# Patient Record
Sex: Male | Born: 1935 | Race: White | Hispanic: No | Marital: Married | Smoking: Never smoker
Health system: Southern US, Community
[De-identification: ages and names within clinical notes are randomized; demographics above are authoritative.]

## PROBLEM LIST (undated history)

## (undated) DIAGNOSIS — R911 Solitary pulmonary nodule: Secondary | ICD-10-CM

## (undated) DIAGNOSIS — R030 Elevated blood-pressure reading, without diagnosis of hypertension: Secondary | ICD-10-CM

## (undated) DIAGNOSIS — C61 Malignant neoplasm of prostate: Secondary | ICD-10-CM

## (undated) DIAGNOSIS — I77810 Thoracic aortic ectasia: Secondary | ICD-10-CM

## (undated) DIAGNOSIS — D649 Anemia, unspecified: Secondary | ICD-10-CM

## (undated) DIAGNOSIS — Z85828 Personal history of other malignant neoplasm of skin: Secondary | ICD-10-CM

## (undated) DIAGNOSIS — J329 Chronic sinusitis, unspecified: Principal | ICD-10-CM

## (undated) DIAGNOSIS — Z86010 Personal history of colonic polyps: Secondary | ICD-10-CM

## (undated) DIAGNOSIS — R351 Nocturia: Secondary | ICD-10-CM

## (undated) DIAGNOSIS — R5383 Other fatigue: Secondary | ICD-10-CM

## (undated) DIAGNOSIS — N410 Acute prostatitis: Secondary | ICD-10-CM

## (undated) DIAGNOSIS — E785 Hyperlipidemia, unspecified: Secondary | ICD-10-CM

## (undated) DIAGNOSIS — R0602 Shortness of breath: Secondary | ICD-10-CM

## (undated) DIAGNOSIS — Z9849 Cataract extraction status, unspecified eye: Secondary | ICD-10-CM

## (undated) DIAGNOSIS — R251 Tremor, unspecified: Secondary | ICD-10-CM

## (undated) DIAGNOSIS — IMO0002 Reserved for concepts with insufficient information to code with codable children: Secondary | ICD-10-CM

## (undated) DIAGNOSIS — Z8711 Personal history of peptic ulcer disease: Secondary | ICD-10-CM

## (undated) DIAGNOSIS — T148XXA Other injury of unspecified body region, initial encounter: Principal | ICD-10-CM

## (undated) DIAGNOSIS — L309 Dermatitis, unspecified: Secondary | ICD-10-CM

## (undated) DIAGNOSIS — I10 Essential (primary) hypertension: Secondary | ICD-10-CM

## (undated) HISTORY — DX: Malignant neoplasm of prostate: C61

## (undated) HISTORY — DX: Elevated blood-pressure reading, without diagnosis of hypertension: R03.0

## (undated) HISTORY — DX: Tremor, unspecified: R25.1

## (undated) HISTORY — DX: Essential (primary) hypertension: I10

## (undated) HISTORY — DX: Other fatigue: R53.83

## (undated) HISTORY — DX: Personal history of peptic ulcer disease: Z87.11

## (undated) HISTORY — DX: Shortness of breath: R06.02

## (undated) HISTORY — DX: Personal history of colonic polyps: Z86.010

## (undated) HISTORY — DX: Reserved for concepts with insufficient information to code with codable children: IMO0002

## (undated) HISTORY — DX: Other injury of unspecified body region, initial encounter: T14.8XXA

## (undated) HISTORY — DX: Personal history of other malignant neoplasm of skin: Z85.828

## (undated) HISTORY — DX: Dermatitis, unspecified: L30.9

## (undated) HISTORY — DX: Hyperlipidemia, unspecified: E78.5

## (undated) HISTORY — DX: Cataract extraction status, unspecified eye: Z98.49

## (undated) HISTORY — DX: Anemia, unspecified: D64.9

## (undated) HISTORY — DX: Acute prostatitis: N41.0

## (undated) HISTORY — DX: Solitary pulmonary nodule: R91.1

## (undated) HISTORY — DX: Nocturia: R35.1

## (undated) HISTORY — PX: TONSILLECTOMY AND ADENOIDECTOMY: SHX28

## (undated) HISTORY — DX: Thoracic aortic ectasia: I77.810

## (undated) HISTORY — DX: Chronic sinusitis, unspecified: J32.9

## (undated) HISTORY — PX: OTHER SURGICAL HISTORY: SHX169

---

## 1958-01-24 HISTORY — PX: ABDOMINAL SURGERY: SHX537

## 1978-09-25 HISTORY — PX: COLONOSCOPY W/ BIOPSIES AND POLYPECTOMY: SHX1376

## 1999-09-24 ENCOUNTER — Encounter: Payer: Self-pay | Admitting: Family Medicine

## 1999-09-24 ENCOUNTER — Encounter: Admission: RE | Admit: 1999-09-24 | Discharge: 1999-09-24 | Payer: Self-pay

## 2001-08-17 ENCOUNTER — Encounter: Payer: Self-pay | Admitting: Orthopedic Surgery

## 2001-08-24 ENCOUNTER — Ambulatory Visit (HOSPITAL_COMMUNITY): Admission: RE | Admit: 2001-08-24 | Discharge: 2001-08-24 | Payer: Self-pay | Admitting: Orthopedic Surgery

## 2008-10-14 ENCOUNTER — Ambulatory Visit (HOSPITAL_COMMUNITY): Admission: RE | Admit: 2008-10-14 | Discharge: 2008-10-14 | Payer: Self-pay | Admitting: Urology

## 2008-11-28 ENCOUNTER — Ambulatory Visit (HOSPITAL_BASED_OUTPATIENT_CLINIC_OR_DEPARTMENT_OTHER): Admission: RE | Admit: 2008-11-28 | Discharge: 2008-11-28 | Payer: Self-pay | Admitting: Urology

## 2010-03-01 ENCOUNTER — Encounter: Payer: Self-pay | Admitting: Family Medicine

## 2010-03-01 ENCOUNTER — Ambulatory Visit (INDEPENDENT_AMBULATORY_CARE_PROVIDER_SITE_OTHER): Payer: Medicare HMO | Admitting: Family Medicine

## 2010-03-01 ENCOUNTER — Ambulatory Visit (INDEPENDENT_AMBULATORY_CARE_PROVIDER_SITE_OTHER): Payer: Self-pay | Admitting: Family Medicine

## 2010-03-01 ENCOUNTER — Other Ambulatory Visit (HOSPITAL_COMMUNITY): Payer: Self-pay | Admitting: Urology

## 2010-03-01 DIAGNOSIS — E785 Hyperlipidemia, unspecified: Secondary | ICD-10-CM

## 2010-03-01 DIAGNOSIS — R079 Chest pain, unspecified: Secondary | ICD-10-CM | POA: Insufficient documentation

## 2010-03-01 DIAGNOSIS — R030 Elevated blood-pressure reading, without diagnosis of hypertension: Secondary | ICD-10-CM

## 2010-03-01 DIAGNOSIS — Z860100 Personal history of colon polyps, unspecified: Secondary | ICD-10-CM

## 2010-03-01 DIAGNOSIS — Z8711 Personal history of peptic ulcer disease: Secondary | ICD-10-CM

## 2010-03-01 DIAGNOSIS — Z85828 Personal history of other malignant neoplasm of skin: Secondary | ICD-10-CM

## 2010-03-01 DIAGNOSIS — C61 Malignant neoplasm of prostate: Secondary | ICD-10-CM

## 2010-03-01 DIAGNOSIS — R351 Nocturia: Secondary | ICD-10-CM

## 2010-03-01 DIAGNOSIS — R0789 Other chest pain: Secondary | ICD-10-CM

## 2010-03-01 DIAGNOSIS — Z9849 Cataract extraction status, unspecified eye: Secondary | ICD-10-CM | POA: Insufficient documentation

## 2010-03-01 HISTORY — DX: Nocturia: R35.1

## 2010-03-01 HISTORY — DX: Cataract extraction status, unspecified eye: Z98.49

## 2010-03-01 HISTORY — DX: Malignant neoplasm of prostate: C61

## 2010-03-01 HISTORY — DX: Personal history of colonic polyps: Z86.010

## 2010-03-01 HISTORY — DX: Personal history of colon polyps, unspecified: Z86.0100

## 2010-03-01 HISTORY — DX: Hyperlipidemia, unspecified: E78.5

## 2010-03-01 HISTORY — DX: Personal history of peptic ulcer disease: Z87.11

## 2010-03-01 HISTORY — DX: Personal history of other malignant neoplasm of skin: Z85.828

## 2010-03-01 HISTORY — DX: Elevated blood-pressure reading, without diagnosis of hypertension: R03.0

## 2010-03-04 ENCOUNTER — Encounter: Payer: Self-pay | Admitting: *Deleted

## 2010-03-11 ENCOUNTER — Ambulatory Visit (HOSPITAL_COMMUNITY)
Admission: RE | Admit: 2010-03-11 | Discharge: 2010-03-11 | Disposition: A | Discharge status: Home / Self Care | Payer: Medicare HMO | Source: Ambulatory Visit | Attending: Urology | Admitting: Urology

## 2010-03-11 ENCOUNTER — Encounter (HOSPITAL_COMMUNITY): Payer: Self-pay

## 2010-03-11 ENCOUNTER — Encounter (HOSPITAL_COMMUNITY)
Admission: RE | Admit: 2010-03-11 | Discharge: 2010-03-11 | Disposition: A | Discharge status: Home / Self Care | Payer: Medicare HMO | Source: Ambulatory Visit | Attending: Urology | Admitting: Urology

## 2010-03-11 DIAGNOSIS — M47812 Spondylosis without myelopathy or radiculopathy, cervical region: Secondary | ICD-10-CM | POA: Insufficient documentation

## 2010-03-11 DIAGNOSIS — C61 Malignant neoplasm of prostate: Secondary | ICD-10-CM

## 2010-03-11 DIAGNOSIS — M79609 Pain in unspecified limb: Secondary | ICD-10-CM | POA: Insufficient documentation

## 2010-03-11 DIAGNOSIS — R937 Abnormal findings on diagnostic imaging of other parts of musculoskeletal system: Secondary | ICD-10-CM | POA: Insufficient documentation

## 2010-03-11 MED ORDER — TECHNETIUM TC 99M MEDRONATE IV KIT: 25.0000 | PACK | Freq: Once | INTRAVENOUS | Status: AC | PRN

## 2010-03-11 NOTE — Assessment & Plan Note (Signed)
Summary: New est care/dt [employer]   Vital Signs:  Patient profile:   77 year old Anderson Height:      66 inches (167.Sergio cm) Weight:      142.50 pounds (Sergio.77 kg) BMI:     23.08 O2 Sat:      99 % on Room air Temp:     98.1 degrees F (36.72 degrees C) oral Pulse rate:   74 / minute BP sitting:   171 / 76  (right arm) Cuff size:   regular  Vitals Entered By: Josph Macho RMA (March 01, 2010 1:19 PM)  O2 Flow:  Room air  Serial Vital Signs/Assessments:  Time      Position  BP       Pulse  Resp  Temp     By                     132/80                         Danise Edge MD  CC: Establish new patient/ CF Is Patient Diabetic? No   History of Present Illness: Patient is a Sergio Anderson in today for new patient appt. He is generally geeling well although he does note he has recently had a spike in his PSA levels. He has a history of Prostate Cancer treated by Dr Vonita Moss of Urology. Patient notes last year his PSA was well below 1 and now his level has spike up to over 7. He denies any concerning symptoms such as back or abdominal pain. No changes in urinary stream/flow/hesitancy. He does note some nocturia getting up every 2 or so hours to urinate every night but this is stable over many years even predating his Prostate CA diagnosis.No recent illness/f/c/anorexia/Palp/SOB/HA/congestion/ fatigue/malaise. Patient does note a long history of chest pain, atypical. HE has had a work up in the past which includes CXR and CT chest and he reports they were unremarkable. The nature of the pain has evolved and at this time it largely substernal and comes on only with certain postion changes/ significant twisting and heavy lifting and resolves when her alters his position again. He had his last colonoscopy in 1990, he reports he had only benign polyps found and he declines a repeat. Moves his bowels comfortably and he denies any bloody or tarry stool  Preventive Screening-Counseling &  Management  Alcohol-Tobacco     Smoking Status: never  Caffeine-Diet-Exercise     Does Patient Exercise: yes  Problems Prior to Update: 1)  Cataract Extractions, Bilateral, Hx of  (ICD-V45.61) 2)  Skin Cancer, Hx of  (ICD-V10.83) 3)  Nocturia  (ICD-788.43) 4)  Colonic Polyps, Hx of  (ICD-V12.72) 5)  Pud, Hx of  (ICD-V12.71) 6)  Elevated Bp Reading Without Dx Hypertension  (ICD-796.2) 7)  Chest Pain, Atypical  (ICD-786.59) 8)  Prostate Cancer  (ICD-185) 9)  Hyperlipidemia  (ICD-272.4)  Current Problems (verified): 1)  Cataract Extractions, Bilateral, Hx of  (ICD-V45.61) 2)  Skin Cancer, Hx of  (ICD-V10.83) 3)  Nocturia  (ICD-788.43) 4)  Colonic Polyps, Hx of  (ICD-V12.72) 5)  Pud, Hx of  (ICD-V12.71) 6)  Elevated Bp Reading Without Dx Hypertension  (ICD-796.2) 7)  Chest Pain, Atypical  (ICD-786.59) 8)  Prostate Cancer  (ICD-185) 9)  Hyperlipidemia  (ICD-272.4)  Medications Prior to Update: 1)  None  Current Medications (verified): 1)  Tamsulosin Hcl 0.4 Mg Caps (Tamsulosin Hcl) .Marland KitchenMarland KitchenMarland Kitchen  Once Daily 2)  Niaspan 500 Mg Cr-Tabs (Niacin (Antihyperlipidemic)) .Marland Kitchen.. 1 Tab By Mouth Q At Bedtime. Take An Aspirin 1 Hour Prior 3)  Simvastatin 20 Mg Tabs (Simvastatin) .... Take 1 Tablet By Mouth At Bedtime 4)  Aspirin 200 Mg Supp (Aspirin) .... At Bedtime  Allergies (verified): 1)  ! Vioxx  Comments:  Nurse/Medical Assistant: The patient's medications and allergies were reviewed with the patient and were updated in the Medication and Allergy Lists. Josph Macho RMA 03-18-2010 1:24 PM)  Past History:  Family History: Last updated: 2010/03/18 Father: deceased@70 , smoker, lung disease Mother: deceased@96 , stroke, hip fracture Siblings:  Sister: deceased@70 , cancer, lung, smoker Brother: 15, Heart disease, s/p MI Brother: 45, A&W Sister: 62, A&W Brother: deceased @ 2months with pneumonia Brother: deceased@65 , alcoholism MGM: deceased@90 , old age MGF: deceased@60 ,  alcoholism PGM: deceased near 32, colon cancer PGF: deceased near 49, ald age Children: Son: 56, A&W Son: 58, A&W  Social History: Last updated: Mar 18, 2010 Retired, ran the [employer] Married Never Smoked Alcohol use-no, quit 12 years ago never had a problem Wears seat belt regularly Regular exercise-yes, walks regularly No dietary restrictions  Risk Factors: Exercise: yes (2010-03-18)  Risk Factors: Smoking Status: never (2010/03/18)  Past Surgical History: 1960, abdominal surgery correction of PUD and hiatal hernia Tonsillectomy & Adenoidectomy Colon polypectomy, several , benign in 1980s Mose surgery to scalp  Family History: Father: deceased@70 , smoker, lung disease Mother: deceased@96 , stroke, hip fracture Siblings:  Sister: deceased@70 , cancer, lung, smoker Brother: 68, Heart disease, s/p MI Brother: 31, A&W Sister: 100, A&W Brother: deceased @ 2months with pneumonia Brother: deceased@65 , alcoholism MGM: deceased@90 , old age MGF: deceased@60 , alcoholism PGM: deceased near 77, colon cancer PGF: deceased near 28, ald age Children: Son: 88, A&W Son: 35, A&W  Social History: Retired, ran the [employer] Married Never Smoked Alcohol use-no, quit 12 years ago never had a problem Wears seat belt regularly Regular exercise-yes, walks regularly No dietary restrictions Smoking Status:  never Does Patient Exercise:  yes  Review of Systems  The patient denies anorexia, fever, weight loss, weight gain, vision loss, decreased hearing, hoarseness, chest pain, syncope, dyspnea on exertion, peripheral edema, prolonged cough, headaches, hemoptysis, abdominal pain, melena, hematochezia, severe indigestion/heartburn, hematuria, incontinence, genital sores, muscle weakness, suspicious skin lesions, transient blindness, difficulty walking, depression, unusual weight change, abnormal bleeding, and enlarged lymph nodes.    Physical Exam  General:   Well-developed,well-nourished,in no acute distress; alert,appropriate and cooperative throughout examination Head:  Normocephalic and atraumatic without obvious abnormalities. No apparent alopecia or balding. Eyes:  No corneal or conjunctival inflammation noted. EOMI. Perrla Ears:  External ear exam shows no significant lesions or deformities.  Otoscopic examination reveals clear canals, tympanic membranes are intact bilaterally without bulging, retraction, inflammation or discharge. Hearing is grossly normal bilaterally. Nose:  External nasal examination shows no deformity or inflammation. Nasal mucosa are pink and moist without lesions or exudates. Mouth:  Oral mucosa and oropharynx without lesions or exudate Neck:  No deformities, masses, or tenderness noted. Lungs:  Normal respiratory effort, chest expands symmetrically. Lungs are clear to auscultation, no crackles or wheezes. Heart:  Normal rate and regular rhythm. S1 and S2 normal without gallop, click, rub or other extra sounds.grade 2/6 systolic murmur.   Abdomen:  Bowel sounds positive,abdomen soft and non-tender without masses, organomegaly or hernias noted. Msk:  No deformity or scoliosis noted of thoracic or lumbar spine.   Pulses:  R and L carotid,dorsalis pedis and posterior tibial pulses  are full and equal bilaterally Extremities:  No clubbing, cyanosis, edema, or deformity noted with normal full range of motion of all joints.   Neurologic:  No cranial nerve deficits noted. Station and gait are normal. Plantar reflexes are down-going bilaterally. DTRs are symmetrical throughout. Sensory, motor and coordinative functions appear intact. Skin:  Intact without suspicious lesions or rashes Cervical Nodes:  No lymphadenopathy noted Psych:  Cognition and judgment appear intact. Alert and cooperative with normal attention span and concentration. No apparent delusions, illusions, hallucinations   Impression & Recommendations:  Problem # 1:   ELEVATED BP READING WITHOUT DX HYPERTENSION (ICD-796.2) Improved after sitting and talking, patient notes he has been told it was mildly hi int he past but it always returned to normal after resting. He is encouraged to avoid sodium and we will reassess at next visit. Report any concerning symptoms  Problem # 2:  SKIN CANCER, HX OF (ICD-V10.83) Follows with dermatology, sees Lannie Fields at Dr Elease Etienne office, encouraged him to continue with this care. No new concerning lesions  Problem # 3:  CHEST PAIN, ATYPICAL (ICD-786.59) Patient reports this is a long standing problem, has had a work up including a CT scan of chest as well as an xray which he report were normal. His pain now is mostly related to position changes and heavy lifting. No associated symptoms. He will avoid heavy lifting and report worsening symptoms. We will request old records to further evaluate. May use Tylenol as needed if pain worsens.  Problem # 4:  HYPERLIPIDEMIA (ICD-272.4)  His updated medication list for this problem includes:    Niaspan 500 Mg Cr-tabs (Niacin (antihyperlipidemic)) .Marland Kitchen... 1 tab by mouth q at bedtime. take an aspirin 1 hour prior    Simvastatin 20 Mg Tabs (Simvastatin) .Marland Kitchen... Take 1 tablet by mouth at bedtime Would like to switch to Simcor when his current supply of meds is gone. Will check FLP and request old records prior to changing meds.  Problem # 5:  COLONIC POLYPS, HX OF (ICD-V12.72) Patient declines a repeat colonoscopy and has no concerning symptoms, will check a CBC and monitor with patient  Complete Medication List: 1)  Tamsulosin Hcl 0.4 Mg Caps (Tamsulosin hcl) .... Once daily 2)  Niaspan 500 Mg Cr-tabs (Niacin (antihyperlipidemic)) .Marland Kitchen.. 1 tab by mouth q at bedtime. take an aspirin 1 hour prior 3)  Simvastatin 20 Mg Tabs (Simvastatin) .... Take 1 tablet by mouth at bedtime 4)  Aspirin 200 Mg Supp (Aspirin) .... At bedtime  Patient Instructions: 1)  Please schedule a follow-up  appointment in 1 to 2  month.  2)  Release of Records Dr Toni Arthurs   Orders Added: 1)  New Patient Level IV [99204]

## 2010-04-01 ENCOUNTER — Ambulatory Visit (INDEPENDENT_AMBULATORY_CARE_PROVIDER_SITE_OTHER): Payer: Medicare HMO | Admitting: Family Medicine

## 2010-04-01 ENCOUNTER — Encounter: Payer: Self-pay | Admitting: Family Medicine

## 2010-04-01 DIAGNOSIS — N410 Acute prostatitis: Secondary | ICD-10-CM

## 2010-04-01 DIAGNOSIS — IMO0002 Reserved for concepts with insufficient information to code with codable children: Secondary | ICD-10-CM

## 2010-04-01 DIAGNOSIS — R030 Elevated blood-pressure reading, without diagnosis of hypertension: Secondary | ICD-10-CM

## 2010-04-01 DIAGNOSIS — E785 Hyperlipidemia, unspecified: Secondary | ICD-10-CM

## 2010-04-01 HISTORY — DX: Reserved for concepts with insufficient information to code with codable children: IMO0002

## 2010-04-01 HISTORY — DX: Acute prostatitis: N41.0

## 2010-04-04 ENCOUNTER — Encounter: Payer: Self-pay | Admitting: *Deleted

## 2010-04-13 NOTE — Assessment & Plan Note (Signed)
Summary: 1 month fu/dt [employer]   Vital Signs:  Patient profile:   77 year old male Height:      66 inches (167.64 cm) Weight:      147.25 pounds (66.93 kg) O2 Sat:      98 % on Room air Temp:     98.2 degrees F (36.78 degrees C) oral Pulse rate:   66 / minute BP sitting:   161 / 88  (right arm) Cuff size:   regular  Vitals Entered By: Josph Macho RMA (April 01, 2010 10:21 AM)  O2 Flow:  Room air CC: follow-up visit/ CF Is Patient Diabetic? No   History of Present Illness: Patient is a 77 yo male in today for follow up on his new patient appt. He is accompanied by his wife who has been drinking more again and he is under a great deal of stress. he feels his blood pressure is up but this reason. He was also recently seen his urologist and was told he had acuteprostatitis but has not taken any antibiotics. He does note urinary frequency some dysuria and discomfort but denies fevers, chills, abdominal pain, changes in his bowels. Denies chest pain, palpitations, shortness of breath, anorexia, nausea, vomiting GI complaints.  Current Medications (verified): 1)  Tamsulosin Hcl 0.4 Mg Caps (Tamsulosin Hcl) .... Once Daily 2)  Niaspan 500 Mg Cr-Tabs (Niacin (Antihyperlipidemic)) .Marland Kitchen.. 1 Tab By Mouth Q At Bedtime. Take An Aspirin 1 Hour Prior 3)  Simvastatin 20 Mg Tabs (Simvastatin) .... Take 1 Tablet By Mouth At Bedtime 4)  Aspirin 200 Mg Supp (Aspirin) .... At Bedtime  Allergies (verified): 1)  ! Vioxx  Past History:  Past medical history reviewed for relevance to current acute and chronic problems. Social history (including risk factors) reviewed for relevance to current acute and chronic problems.  Social History: Reviewed history from 03/01/2010 and no changes required. 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 No apparent alopecia or balding. Mouth:  Oral mucosa and oropharynx without lesions or exudates.  Teeth in good repair. Neck:  No deformities, masses, or tenderness noted. Lungs:  Normal respiratory effort, chest expands symmetrically. Lungs are clear to auscultation, no crackles or wheezes. Heart:  Normal rate and regular rhythm. S1 and S2 normal without gallop, murmur, click, rub or other extra sounds. Abdomen:  Bowel sounds positive,abdomen soft and non-tender without masses, organomegaly or hernias noted. Extremities:  No clubbing, cyanosis, edema, or deformity noted with normal full range of motion of all joints.   Psych:  Cognition and judgment appear intact. Alert and cooperative with normal attention span and concentration. No apparent delusions, illusions, hallucinations   Impression & Recommendations:  Problem # 1:  BACK PAIN, LUMBAR, WITH RADICULOPATHY (ICD-724.4)  His updated medication list for this problem includes:    Aspirin 200 Mg Supp (Aspirin) .Marland Kitchen... At bedtime  Orders: Orthopedic Referral (Ortho) Pain has flared again and patient has had a good response to epidural injections in the past is referred back at his request. Consider moist heat as needed   Problem # 2:  ELEVATED BP READING WITHOUT DX HYPERTENSION (ICD-796.2) Patient's BP is up somewhat today but the  patient is under a great deal of stress due to his wife's increased drinking. He feels the stress is increasing his BP. He agrees to watch his sodium and report any concerning symptoms such as CP/HA for further evaluation  Problem # 3:  PROSTATITIS, ACUTE (ICD-601.0) Start Cipro and Align and report if symptoms worsen.  Problem # 4:  HYPERLIPIDEMIA  (ICD-272.4)  His updated medication list for this problem includes:    Niaspan 500 Mg Cr-tabs (Niacin (antihyperlipidemic)) .Marland Kitchen... 1 tab by mouth q at bedtime. take an aspirin 1 hour prior    Simvastatin 20 Mg Tabs (Simvastatin) .Marland Kitchen... Take 1 tablet by mouth at bedtime Tolerating meds, no changes today  Complete Medication List: 1)  Tamsulosin Hcl 0.4 Mg Caps (Tamsulosin hcl) .... Once daily 2)  Niaspan 500 Mg Cr-tabs (Niacin (antihyperlipidemic)) .Marland Kitchen.. 1 tab by mouth q at bedtime. take an aspirin 1 hour prior 3)  Simvastatin 20 Mg Tabs (Simvastatin) .... Take 1 tablet by mouth at bedtime 4)  Aspirin 200 Mg Supp (Aspirin) .... At bedtime 5)  Cipro 250 Mg Tabs (Ciprofloxacin hcl) .Marland Kitchen.. 1 tab by mouth two times a day x 14 days 6)  Align 4 Mg Caps (Probiotic product) .Marland Kitchen.. 1 cap by mouth daily as needed when taking antibiotics  Patient Instructions: 1)  Please schedule a follow-up appointment in 1 month.  2)  Needs labs in 3 weeks, FLP/PSA/liver/renal/cbc/tsh 3)  Call with any concerns 4)  Consider DanActive yogurt daily especially on antibiotics 5)  Add Benefiber powder 2 tsp by mouth in fluids of food daily. 6)  Start a probiotic, such as Align capsules Prescriptions: CIPRO 250 MG TABS (CIPROFLOXACIN HCL) 1 tab by mouth two times a day x 14 days  #28 x 0   Entered and Authorized by:   Danise Edge MD   Signed by:   Danise Edge MD on 04/01/2010   Method used:   Electronically to        [facility] [address] [phone]* (retail)       [address]       [place], [place]  [address]       Ph: [phone] or [phone]       Fax: [phone]   RxID:   [id number]    Orders Added: 1)  Orthopedic Referral [Ortho] 2)  Est. Patient Level IV [47425]

## 2010-04-28 LAB — POCT HEMOGLOBIN-HEMACUE: Hemoglobin: 13 g/dL (ref 13.0–17.0)

## 2010-05-20 ENCOUNTER — Encounter: Payer: Self-pay | Admitting: Family Medicine

## 2010-05-20 ENCOUNTER — Ambulatory Visit (INDEPENDENT_AMBULATORY_CARE_PROVIDER_SITE_OTHER): Payer: Medicare HMO | Admitting: Family Medicine

## 2010-05-20 DIAGNOSIS — C61 Malignant neoplasm of prostate: Secondary | ICD-10-CM

## 2010-05-20 DIAGNOSIS — IMO0002 Reserved for concepts with insufficient information to code with codable children: Secondary | ICD-10-CM

## 2010-05-20 DIAGNOSIS — E785 Hyperlipidemia, unspecified: Secondary | ICD-10-CM

## 2010-05-20 DIAGNOSIS — R030 Elevated blood-pressure reading, without diagnosis of hypertension: Secondary | ICD-10-CM

## 2010-05-20 DIAGNOSIS — IMO0001 Reserved for inherently not codable concepts without codable children: Secondary | ICD-10-CM

## 2010-05-20 LAB — CBC WITH DIFFERENTIAL/PLATELET
Eosinophils Relative: 1.9 % (ref 0.0–5.0)
HCT: 38.2 % — ABNORMAL LOW (ref 39.0–52.0)
Hemoglobin: 12.7 g/dL — ABNORMAL LOW (ref 13.0–17.0)
Lymphocytes Relative: 19 % (ref 12.0–46.0)
Lymphs Abs: 2.1 10*3/uL (ref 0.7–4.0)
Monocytes Relative: 8.1 % (ref 3.0–12.0)
Neutro Abs: 7.9 10*3/uL — ABNORMAL HIGH (ref 1.4–7.7)
RBC: 4.28 Mil/uL (ref 4.22–5.81)
WBC: 11.1 10*3/uL — ABNORMAL HIGH (ref 4.5–10.5)

## 2010-05-20 LAB — HEPATIC FUNCTION PANEL
ALT: 8 U/L (ref 0–53)
AST: 13 U/L (ref 0–37)
Albumin: 3.6 g/dL (ref 3.5–5.2)
Alkaline Phosphatase: 84 U/L (ref 39–117)

## 2010-05-20 LAB — RENAL FUNCTION PANEL
CO2: 26 mEq/L (ref 19–32)
Chloride: 107 mEq/L (ref 96–112)
GFR: 53.33 mL/min — ABNORMAL LOW (ref >60.00)
Phosphorus: 3.7 mg/dL (ref 2.3–4.6)
Potassium: 5.4 mEq/L — ABNORMAL HIGH (ref 3.5–5.1)
Sodium: 141 mEq/L (ref 135–145)

## 2010-05-20 LAB — PSA: PSA: 14.01 ng/mL — ABNORMAL HIGH (ref 0.10–4.00)

## 2010-05-20 LAB — LIPID PANEL
Total CHOL/HDL Ratio: 4
Triglycerides: 114 mg/dL (ref 0.0–149.0)

## 2010-05-20 MED ORDER — TAMSULOSIN HCL 0.4 MG PO CAPS: 0.4000 mg | ORAL_CAPSULE | Freq: Every day | ORAL | Status: DC

## 2010-05-20 NOTE — Assessment & Plan Note (Signed)
Patient has not eaten today so we will check his FLP and continue his Zocor at the current dose for now

## 2010-05-20 NOTE — Progress Notes (Signed)
Sergio Anderson [id number] 1926/05/10 05/20/2010      Progress Note-Follow Up  Subjective  Chief Complaint  Chief Complaint  Patient presents with  . Back Pain    follow up w/ labs    HPI  Patient is in today for further follow up and is doing relatively. He has recently been to his Dr Ethelene Hal and had an epidural injection recently. It has helped his low back pain but his radicular pain in the leg is still present worse with walking and standing. No fevers/chills/cp/palp/sob/GI or GU c/o. He c/o some mild constipation but he actually moves his bowels every 1-2 days by drinking a glass of milk. No bloody or tarry stool His urinary symptoms have greatly improved. No dysuria, hematuria or enuresis. He is taking his Simvastatin for his cholesterol regularly and tolerating it.  Past Medical History  Diagnosis Date  . SKIN CANCER, HX OF 03/01/2010  . PUD, HX OF 03/01/2010  . PROSTATITIS, ACUTE 04/01/2010  . PROSTATE CANCER 03/01/2010  . Nocturia 03/01/2010  . HYPERLIPIDEMIA 03/01/2010  . ELEVATED BP READING WITHOUT DX HYPERTENSION 03/01/2010  . COLONIC POLYPS, HX OF 03/01/2010  . CATARACT EXTRACTIONS, BILATERAL, HX OF 03/01/2010  . BACK PAIN, LUMBAR, WITH RADICULOPATHY 04/01/2010    Past Surgical History  Procedure Date  . Abdominal surgery 1960    Correction of PUD and hiatal hernia  . Tonsillectomy and adenoidectomy   . Colonoscopy w/ biopsies and polypectomy 1980's    several, benign  . Mose surgery to scalp     Family History  Problem Relation Age of Onset  . Stroke Mother   . Hip fracture Mother   . Lung disease Father     smoker  . Cancer Sister     lung/ smoker  . Heart disease Brother   . Heart attack Brother   . Alcohol abuse Maternal Grandfather   . Cancer Paternal Grandmother     colon  . Pneumonia Brother   . Alcohol abuse Brother   . Achalasia Son     History   Social History  . Marital Status: Married    Spouse Name: N/A    Number of Children: N/A  . Years of  Education: N/A   Occupational History  . Not on file.   Social History Main Topics  . Smoking status: Never Smoker   . Smokeless tobacco: Not on file  . Alcohol Use: No  . Drug Use:   . Sexually Active:    Other Topics Concern  . Not on file   Social History Narrative  . No narrative on file    Current Outpatient Prescriptions on File Prior to Visit  Medication Sig Dispense Refill  . niacin (NIASPAN) 500 MG CR tablet Take 500 mg by mouth at bedtime. Take an aspirin 1 hour prior       . simvastatin (ZOCOR) 20 MG tablet Take 20 mg by mouth at bedtime.        Marland Kitchen DISCONTD: Tamsulosin HCl (FLOMAX) 0.4 MG CAPS Take by mouth daily.        Marland Kitchen aspirin 200 MG suppository Place 200 mg rectally at bedtime.        . ciprofloxacin (CIPRO) 250 MG tablet Take 250 mg by mouth 2 (two) times daily. X 14 days       . Probiotic Product (ALIGN) 4 MG CAPS Take by mouth. 1 capsule po daily prn when taking antibiotics         Allergies  Allergen  Reactions  . Rofecoxib     Review of Systems  Review of Systems  Constitutional: Negative for fever and malaise/fatigue.  HENT: Negative for congestion.   Eyes: Negative for discharge.  Respiratory: Negative for shortness of breath.   Cardiovascular: Negative for chest pain, palpitations and leg swelling.  Gastrointestinal: Negative for nausea, abdominal pain and diarrhea.  Genitourinary: Negative for dysuria.  Musculoskeletal: Positive for back pain. Negative for falls.       Right lower extremity radicular symptoms come and go  Skin: Negative for rash.  Neurological: Negative for loss of consciousness and headaches.  Endo/Heme/Allergies: Negative for polydipsia.  Psychiatric/Behavioral: Negative for depression and suicidal ideas. The patient is not nervous/anxious and does not have insomnia.     Objective  BP 143/69  Pulse 60  Temp(Src) 98.1 F (36.7 C) (Oral)  Ht 5\' 6"  (1.676 m)  Wt 136 lb 12.8 oz (62.052 kg)  BMI 22.08 kg/m2  SpO2  98%  Physical Exam  Physical Exam  Constitutional: He is oriented to person, place, and time and well-developed, well-nourished, and in no distress. No distress.  HENT:  Head: Normocephalic and atraumatic.  Eyes: Conjunctivae are normal.  Neck: Neck supple. No thyromegaly present.  Cardiovascular: Normal rate.        Regularly irregular 2/6 murmur  Pulmonary/Chest: Effort normal and breath sounds normal. No respiratory distress.  Abdominal: He exhibits no distension and no mass. There is no tenderness.  Musculoskeletal: He exhibits no edema.  Neurological: He is alert and oriented to person, place, and time.  Skin: Skin is warm.  Psychiatric: Memory, affect and judgment normal.    No results found for this basename: TSH   Lab Results  Component Value Date   WBC 11.1* 05/20/2010   HGB 12.7* 05/20/2010   HCT 38.2* 05/20/2010   MCV 89.2 05/20/2010   PLT 332.0 05/20/2010   Lab Results  Component Value Date   CREATININE 1.4 05/20/2010   BUN 25* 05/20/2010   NA 141 05/20/2010   K 5.4* 05/20/2010   CL 107 05/20/2010   CO2 26 05/20/2010   Lab Results  Component Value Date   ALT 8 05/20/2010   AST 13 05/20/2010   ALKPHOS 84 05/20/2010   BILITOT 0.5 05/20/2010   Lab Results  Component Value Date   CHOL 132 05/20/2010   Lab Results  Component Value Date   HDL 33.10* 05/20/2010   Lab Results  Component Value Date   LDLCALC 76 05/20/2010   Lab Results  Component Value Date   TRIG 114.0 05/20/2010   Lab Results  Component Value Date   CHOLHDL 4 05/20/2010     Assessment & Plan  PROSTATE CANCER Patient is following with Urology, his PSA had dropped to 7 and now is back up to 14 but the patient is asymptomatic. We will have him follow up with urology and consider treating for acute prostatitis again if he becomes more symptomatic  HYPERLIPIDEMIA Patient has not eaten today so we will check his FLP and continue his Zocor at the current dose for now  ELEVATED BP READING WITHOUT  DX HYPERTENSION Numbers acceptable today, minimize sodium and continue to monitor  BACK PAIN, LUMBAR, WITH RADICULOPATHY Patient has seen Dr Ethelene Hal and under gone an epidural injection. His previous injection provided 2 years of relief but this shot did not provide as much relief. The low back is better but his radicular symptoms are still notable especially while standing and walking. He is encouraged to  try Tylenol prn and will consider some Lidoderm patches to see if they are helpful

## 2010-05-20 NOTE — Assessment & Plan Note (Signed)
Patient is following with Urology, his PSA had dropped to 7 and now is back up to 14 but the patient is asymptomatic. We will have him follow up with urology and consider treating for acute prostatitis again if he becomes more symptomatic

## 2010-05-20 NOTE — Patient Instructions (Signed)
Hypercholesterolemia High Blood Cholesterol Cholesterol is a white, waxy, fat-like protein needed by your body in small amounts. The liver makes all the cholesterol you need. It is carried from the liver by the blood through the blood vessels. Deposits (plaque) may build up on blood vessel walls. This makes the arteries narrower and stiffer. Plaque increases the risk for heart attack and stroke. You cannot feel your cholesterol level even if it is very high. The only way to know is by a blood test to check your lipid (fats) levels. Once you know your cholesterol levels, you should keep a record of the test results. Work with your caregiver to to keep your levels in the desired range. WHAT THE RESULTS MEAN:  Total cholesterol is a rough measure of all the cholesterol in your blood.   LDL is the so-called bad cholesterol. This is the type that deposits cholesterol in the walls of the arteries. You want this level to be low.   HDL is the good cholesterol because it cleans the arteries and carries the LDL away. You want this level to be high.   Triglycerides are fat that the body can either burn for energy or store. High levels are closely linked to heart disease.  DESIRED LEVELS:  Total cholesterol below 200.   LDL below 100 for people at risk, below 70 for very high risk.   HDL above 50 is good, above 60 is best.   Triglycerides below 150.  HOW TO LOWER YOUR CHOLESTEROL:  Diet.   Choose fish or white meat chicken and [place], roasted or baked. Limit fatty cuts of red meat, fried foods, and processed meats, such as sausage and lunch meat.   Eat lots of fresh fruits and vegetables. Choose whole grains, beans, pasta, potatoes and cereals.   Use only small amounts of olive, corn or canola oils. Avoid butter, mayonnaise, shortening or palm kernel oils. Avoid foods with trans-fats.   Use skim/nonfat milk and low-fat/nonfat yogurt and cheeses. Avoid whole milk, cream, ice cream, egg yolks and  cheeses. Healthy desserts include angel food cake, gingersnaps, animal crackers, hard candy, popsicles, and low-fat/nonfat frozen yogurt. Avoid pastries, cakes, pies and cookies.   Exercise.   A regular program helps decrease LDL and raises HDL.   Helps with weight control.   Do things that increase your activity level like gardening, walking, or taking the stairs.   Medication.   May be prescribed by your caregiver to help lowering cholesterol and the risk for heart disease.   You may need medicine even if your levels are normal if you have several risk factors.  HOME CARE INSTRUCTIONS  Follow your diet and exercise programs as suggested by your caregiver.   Take medications as directed.   Have blood work done when your caregiver feels it is necessary.  MAKE SURE YOU:   Understand these instructions.   Will watch your condition.   Will get help right away if you are not doing well or get worse.  Document Released: 01/10/2005 Document Re-Released: 12/24/2007 [facility] Patient Information 2011 [place], [place].  Use Tylenol as needed for pain, call Dr Ethelene Hal if pain in right leg does not get better

## 2010-05-20 NOTE — Assessment & Plan Note (Signed)
Numbers acceptable today, minimize sodium and continue to monitor

## 2010-05-20 NOTE — Assessment & Plan Note (Signed)
Patient has seen Dr Ethelene Hal and under gone an epidural injection. His previous injection provided 2 years of relief but this shot did not provide as much relief. The low back is better but his radicular symptoms are still notable especially while standing and walking. He is encouraged to try Tylenol prn and will consider some Lidoderm patches to see if they are helpful

## 2010-06-03 ENCOUNTER — Other Ambulatory Visit: Payer: Medicare Other

## 2010-06-04 ENCOUNTER — Other Ambulatory Visit (INDEPENDENT_AMBULATORY_CARE_PROVIDER_SITE_OTHER): Payer: Medicare HMO

## 2010-06-04 DIAGNOSIS — R972 Elevated prostate specific antigen [PSA]: Secondary | ICD-10-CM

## 2010-06-07 ENCOUNTER — Other Ambulatory Visit: Payer: Self-pay

## 2010-06-07 MED ORDER — CIPROFLOXACIN HCL 250 MG PO TABS: 250.0000 mg | ORAL_TABLET | Freq: Two times a day (BID) | ORAL | Status: AC

## 2010-06-08 ENCOUNTER — Other Ambulatory Visit: Payer: Self-pay

## 2010-06-08 MED ORDER — SIMVASTATIN 20 MG PO TABS: 20.0000 mg | ORAL_TABLET | Freq: Every day | ORAL | Status: DC

## 2010-06-08 MED ORDER — NIACIN ER (ANTIHYPERLIPIDEMIC) 500 MG PO TBCR: 500.0000 mg | EXTENDED_RELEASE_TABLET | Freq: Every day | ORAL | Status: DC

## 2010-06-11 NOTE — Op Note (Signed)
TNAMEARTHOR, GORTER                      ACCOUNT NO.:  [id number]   MEDICAL RECORD NO.:  [id number]                   PATIENT TYPE:  AMB   LOCATION:  DAY                                  FACILITY:  [facility]   PHYSICIAN:  James P. Aplington, M.D.            DATE OF BIRTH:  1926-04-11   DATE OF PROCEDURE:  08/24/2001  DATE OF DISCHARGE:                                 OPERATIVE REPORT   PREOPERATIVE DIAGNOSES:  1. Torn medial meniscus.  2. Osteoarthritis of left knee.   POSTOPERATIVE DIAGNOSES:  1. Torn medial and lateral menisci.  2. Chondrocalcinosis.  3. Medial compartment degenerative arthritis, left knee.   OPERATION:  1. Left knee arthroscopy with partial medial and lateral meniscectomies.  2. Debridement of medial femoral condyle.   SURGEON:  Tomasa Blase, M.D.   ASSISTANT:  Nurse.   ANESTHESIA:  General.   INDICATIONS FOR PROCEDURE:  Because of pain to the left knee with known  medial compartment arthritis on plain x-ray.  Had a MRI which demonstrated a  posterior horn tear of the medial meniscus.  He was also noted to have  chondrocalcinosis on his plain x-rays.  He is here today because of the  significant pain, discussion as above.   PROCEDURE:  After satisfactory general anesthesia, the pneumatic tourniquet  applied to the leg.  Left knee was prepped with Duraprep and draped as a  sterile field.  Superior medial saline inflow.  First through an  anterolateral portal in medial compartment, the knee joint was evaluated  anteriorly.  He had a good bit of synovitis fibrotic in nature in most  areas, which is probably adherent to the medial meniscus and accompanied  with some scuffing of the medial meniscus.  The synovium was debrided out,  and the anterior meniscus shaved.  Over most of the weightbearing surface of  the medial femoral condyle he had grade III chondromalacia with most of the  articular cartilage being coated with spicules of calcium, and  very soft and  partially detached.  I smoothed this down as much as I could with the 3.5  shaver.  Looking posteriorly, his medial meniscus was torn at the entire  posterior third, going from the curve into the intracondylar area.  This was  trimmed back with small baskets to a stable rim, and shaved down until  smooth with the 3.5 shaver.  Calcium was encrusted in a good bit of this  meniscus.  His ACL was noted to be intact.  In the medial suprapatellar area  there was some more area of the patella, but nothing that needs arthroscopic  treatment.  I then reversed portals.  The lateral meniscus had a good bit of  calcium in it, and had a tear in the mid posterior third, as well as  significant fraying along the inner border of the lateral meniscus.  I  debrided back the posterior horn tear with small baskets,  and shaved down  the remaining meniscus until smooth with a 3.5 shaver.  I also debrided out  some of the calcium from the anterior lateral tibial plateau.  The knee  joint was then irrigated until cleared, all ports were removed.  The  transverse portals were closed with 4-0 nylon, 20 cc of 0.50%  Marcaine with 4 mg of morphine were then instilled through the inferior  upper apparatus which was removed,and this portal was closed with 4-0 nylon  as well.  Adaptive gauze were applied.  The tourniquet was released.  He  tolerated the procedure well, and was returned to recovery room in  satisfactory condition with no complications.                                                James P. Aplington, M.D.    JPA/MEDQ  D:  08/24/2001  T:  08/30/2001  Job:  [id number]

## 2010-07-08 ENCOUNTER — Ambulatory Visit (INDEPENDENT_AMBULATORY_CARE_PROVIDER_SITE_OTHER): Payer: Medicare HMO | Admitting: Family Medicine

## 2010-07-08 ENCOUNTER — Encounter: Payer: Self-pay | Admitting: Family Medicine

## 2010-07-08 DIAGNOSIS — R030 Elevated blood-pressure reading, without diagnosis of hypertension: Secondary | ICD-10-CM

## 2010-07-08 DIAGNOSIS — N410 Acute prostatitis: Secondary | ICD-10-CM

## 2010-07-08 NOTE — Patient Instructions (Signed)
Prostatitis Prostatitis is an inflammation (the body's way of reacting to injury and/or infection) of the prostate gland. The prostate gland is a male organ. The gland is about the size and shape of a walnut. The prostate is located just below the bladder. It produces semen, which is a fluid that helps nourish and transport sperm. Prostatitis is the most common urinary tract problem in men younger than age 75. There are 4 categories of prostatitis:  I - Acute bacterial prostatitis.   II - Chronic bacterial prostatitis.   III - Chronic prostatitis and chronic pelvic pain syndrome (CPPS).   Inflammatory.   Non inflammatory.   IV - Asymptomatic inflammatory prostatitis.  Acute and chronic bacterial prostatitis are problems with bacterial infections of the prostate. "Acute" infection is usually a one-time problem. "Chronic" bacterial prostatitis is a condition with recurrent infection. It is usually caused by the same germ (bacteria). CPPS has symptoms similar to prostate infection. However, no infection is actually found. This condition can cause problems of ongoing pain. Currently, it cannot be cured. Treatments are available and aimed at symptom control.  Asymptomatic inflammatory prostatitis has no symptoms. It is a condition where infection-fighting cells are found by chance in the urine. The diagnosis is made most often during an exam for other conditions. Other conditions could be infertility or a high level of PSA (prostate-specific antigen) in the blood. SYMPTOMS Symptoms can vary depending upon the type of prostatitis that exists. There can also be overlap in symptoms. This can make diagnosis difficult. Symptoms: For Acute bacterial prostatitis  Painful urination.  Fever and/or chills.   Muscle and/or joint pains.   Low back pain.   Low abdominal pain.   Inability to empty bladder completely.   Sudden urges to urinate.  Frequent urination during the day.   Difficulty  starting urine stream.   Need to urinate several times at night (nocturia).   Weak urine stream.   Urethral (tube that carries urine from the bladder out of the body) discharge and dribbling after urination.   For Chronic bacterial prostatitis  Rectal pain.   Pain in the testicles, penis, or tip of the penis.   Pain in the space between the anus and scrotum (perineum).   Low back pain.   Low abdominal pain.   Problems with sexual function.   Painful ejaculation.   Bloody semen.   Inability to empty bladder completely.   Painful urination.   Sudden urges to urinate.   Frequent urination during the day.   Difficulty starting urine stream.   Need to urinate several times at night (nocturia).   Weak urine stream.   Dribbling after urination.   Urethral discharge.   For Chronic prostatitis and chronic pelvic pain syndrome (CPPS) Symptoms are the same as those for chronic bacterial prostatitis. Problems with sexual function are often the reason for seeking care. This important problem should be discussed with your caregiver. For Asymptomatic inflammatory prostatitis As noted above, there are no symptoms with this condition. DIAGNOSIS  Your caregiver may perform a rectal exam. This exam is to determine if the prostate is swollen and tender.   Sometimes blood work is performed. This is done to see if your white blood cell count is elevated. The Prostate Specific Antigen (PSA) is also measured. PSA is a blood test that can help detect early prostate cancer.   A urinalysis is done to find out what type of infection is present if this is a suspected cause. An additional  urinalysis may be done after a digital rectal exam. This is to see if white blood cells are pushed out of the prostate and into the urine. A low-grade infection of the prostate may not be found on the first urinalysis.  In more difficult cases, your caregiver may advise other tests. Tests could  include:  Urodynamics -- Tests the function of the bladder and the organs involved in triggering and controlling normal urination.   Urine flow rate.   Cystoscopy -- In this procedure, a thin, telescope-like tube with a light and tiny camera attached (cystoscope) is inserted into the bladder through the urethra. This allows the caregiver to see the inside of the urethra and bladder.   Electromyography -- This procedure tests how the muscles and nerves of the bladder work. It is focused on the muscles that control the anus and pelvic floor. These are the muscles between the anus and scrotum.  In people who show no signs of infection, certain uncommon infections might be causing constant or recurrent symptoms. These uncommon infections are difficult to detect. More work in medicine may help find solutions to these problems. TREATMENT Antibiotics are used to treat infections caused by germs. If the infection is not treated and becomes long lasting (chronic), it may become a lower grade infection with minor, continual problems. Without treatment, the prostate may develop a boil or furuncle (abscess). This may require surgical treatment. For those with chronic prostatitis and CPPS, it is important to work closely with your primary caregiver and urologist. For some, the medicines that are used to treat a non-cancerous, enlarged prostate (benign prostatic hypertrophy) may be helpful. Referrals to specialists other than urologists may be necessary. In rare cases when all treatments have been inadequate for pain control, an operation to remove the prostate may be recommended. This is very rare and before this is considered thorough discussion with your urologist is highly recommended.  In cases of secondary to chronic non-bacterial prostatitis, a good relationship with your urologist or primary caregiver is essential because it is often a recurrent prolonged condition that requires a good understanding of the  causes and a commitment to therapy aimed at controlling your symptoms. HOME CARE INSTRUCTIONS  Hot sitz baths for 20 minutes, 4 times per day, may help relieve pain.   Non-prescription pain killers may be used as your caregiver recommends if you have no allergies to them. Some illnesses or conditions prevent use of non-prescription drugs. If unsure, check with your caregiver. Take all medications as directed. Take the antibiotics for the prescribed length of time, even if you are feeling better.  SEEK MEDICAL CARE IF:  You have any worsening of the symptoms that originally brought you to your caregiver.   You have an oral temperature above 104.   You experience any side effects from medications prescribed.  SEEK IMMEDIATE MEDICAL CARE IF:  You have an oral temperature above 104, not controlled by medicine.   You have pain not relieved with medications.   You develop nausea, vomiting, lightheadedness, or have a fainting episode.   You are unable to urinate.   You pass bloody urine or clots.  Document Released: 01/08/2000 Document Re-Released: 04/06/2009 [facility] Patient Information 2011 [place], [place].

## 2010-07-10 ENCOUNTER — Encounter: Payer: Self-pay | Admitting: Family Medicine

## 2010-07-10 NOTE — Assessment & Plan Note (Signed)
Well controlled at today's visit, no concerning symptoms

## 2010-07-10 NOTE — Assessment & Plan Note (Signed)
Patient with decreased frequency and feeling well, has finished course of Cirpofloxacin, he will return next week for follow up PSA level

## 2010-07-10 NOTE — Progress Notes (Signed)
Sergio Anderson [id number] 1926/05/19 07/10/2010      Progress Note-Follow Up  Subjective  Chief Complaint  Chief Complaint  Patient presents with  . Elevated PSA    HPI  Patient is in today for followup feeling well and off for no concerns. He denies any recent febrile illness, chills, chest pain, palpitations, shortness of breath, GI complaints. His urinary frequency and nocturia persist but are improved and he denies any dysuria or urinary hesitancy. He has tolerated his course of ciprofloxacin and has not had any GI upset.   Past Medical History  Diagnosis Date  . SKIN CANCER, HX OF 03/01/2010  . PUD, HX OF 03/01/2010  . PROSTATITIS, ACUTE 04/01/2010  . PROSTATE CANCER 03/01/2010  . Nocturia 03/01/2010  . HYPERLIPIDEMIA 03/01/2010  . ELEVATED BP READING WITHOUT DX HYPERTENSION 03/01/2010  . COLONIC POLYPS, HX OF 03/01/2010  . CATARACT EXTRACTIONS, BILATERAL, HX OF 03/01/2010  . BACK PAIN, LUMBAR, WITH RADICULOPATHY 04/01/2010    Past Surgical History  Procedure Date  . Abdominal surgery 1960    Correction of PUD and hiatal hernia  . Tonsillectomy and adenoidectomy   . Colonoscopy w/ biopsies and polypectomy 1980's    several, benign  . Mose surgery to scalp     Family History  Problem Relation Age of Onset  . Stroke Mother   . Hip fracture Mother   . Lung disease Father     smoker  . Cancer Sister     lung/ smoker  . Heart disease Brother   . Heart attack Brother   . Alcohol abuse Maternal Grandfather   . Cancer Paternal Grandmother     colon  . Pneumonia Brother   . Alcohol abuse Brother   . Achalasia Son     History   Social History  . Marital Status: Married    Spouse Name: N/A    Number of Children: N/A  . Years of Education: N/A   Occupational History  . Not on file.   Social History Main Topics  . Smoking status: Never Smoker   . Smokeless tobacco: Not on file  . Alcohol Use: No  . Drug Use:   . Sexually Active:    Other Topics Concern  . Not on  file   Social History Narrative  . No narrative on file    Current Outpatient Prescriptions on File Prior to Visit  Medication Sig Dispense Refill  . aspirin 200 MG suppository Place 200 mg rectally at bedtime.        . niacin (NIASPAN) 500 MG CR tablet Take 1 tablet (500 mg total) by mouth at bedtime. Take an aspirin 1 hour prior  30 tablet  5  . Probiotic Product (ALIGN) 4 MG CAPS Take by mouth. 1 capsule po daily prn when taking antibiotics       . simvastatin (ZOCOR) 20 MG tablet Take 1 tablet (20 mg total) by mouth at bedtime.  30 tablet  5  . Tamsulosin HCl (FLOMAX) 0.4 MG CAPS Take 1 capsule (0.4 mg total) by mouth daily.  30 capsule  11    Allergies  Allergen Reactions  . Rofecoxib     Review of Systems  Review of Systems  Constitutional: Negative for fever and malaise/fatigue.  HENT: Negative for congestion.   Eyes: Negative for discharge.  Respiratory: Negative for shortness of breath.   Cardiovascular: Negative for chest pain, palpitations and leg swelling.  Gastrointestinal: Negative for nausea, abdominal pain and diarrhea.  Genitourinary: Positive for  frequency. Negative for dysuria, urgency, hematuria and flank pain.  Musculoskeletal: Negative for falls.  Skin: Negative for rash.  Neurological: Negative for loss of consciousness and headaches.  Endo/Heme/Allergies: Negative for polydipsia.  Psychiatric/Behavioral: Negative for depression and suicidal ideas. The patient is not nervous/anxious and does not have insomnia.     Objective  BP 136/70  Pulse 62  Temp(Src) 97.9 F (36.6 C) (Oral)  Ht 5\' 6"  (1.676 m)  Wt 137 lb 6.4 oz (62.324 kg)  BMI 22.18 kg/m2  SpO2 100%  Physical Exam  Physical Exam  Constitutional: He is oriented to person, place, and time and well-developed, well-nourished, and in no distress. No distress.  HENT:  Head: Normocephalic and atraumatic.  Eyes: Conjunctivae are normal.  Neck: Neck supple. No thyromegaly present.    Cardiovascular: Normal rate, regular rhythm and normal heart sounds.   Pulmonary/Chest: Effort normal and breath sounds normal. No respiratory distress.  Abdominal: He exhibits no distension and no mass. There is no tenderness.  Musculoskeletal: He exhibits no edema.  Neurological: He is alert and oriented to person, place, and time.  Skin: Skin is warm.  Psychiatric: Memory, affect and judgment normal.    No results found for this basename: TSH   Lab Results  Component Value Date   WBC 11.1* 05/20/2010   HGB 12.7* 05/20/2010   HCT 38.2* 05/20/2010   MCV 89.2 05/20/2010   PLT 332.0 05/20/2010   Lab Results  Component Value Date   CREATININE 1.4 05/20/2010   BUN 25* 05/20/2010   NA 141 05/20/2010   K 5.4* 05/20/2010   CL 107 05/20/2010   CO2 26 05/20/2010   Lab Results  Component Value Date   ALT 8 05/20/2010   AST 13 05/20/2010   ALKPHOS 84 05/20/2010   BILITOT 0.5 05/20/2010   Lab Results  Component Value Date   CHOL 132 05/20/2010   Lab Results  Component Value Date   HDL 33.10* 05/20/2010   Lab Results  Component Value Date   LDLCALC 76 05/20/2010   Lab Results  Component Value Date   TRIG 114.0 05/20/2010   Lab Results  Component Value Date   CHOLHDL 4 05/20/2010     Assessment & Plan  PROSTATITIS, ACUTE Patient with decreased frequency and feeling well, has finished course of Cirpofloxacin, he will return next week for follow up PSA level  ELEVATED BP READING WITHOUT DX HYPERTENSION Well controlled at today's visit, no concerning symptoms

## 2010-07-15 ENCOUNTER — Other Ambulatory Visit (INDEPENDENT_AMBULATORY_CARE_PROVIDER_SITE_OTHER): Payer: Medicare HMO

## 2010-07-15 DIAGNOSIS — R972 Elevated prostate specific antigen [PSA]: Secondary | ICD-10-CM

## 2010-07-22 ENCOUNTER — Ambulatory Visit: Payer: Medicare HMO | Admitting: Family Medicine

## 2010-08-27 ENCOUNTER — Encounter: Payer: Self-pay | Admitting: Family Medicine

## 2010-09-09 ENCOUNTER — Ambulatory Visit (INDEPENDENT_AMBULATORY_CARE_PROVIDER_SITE_OTHER): Payer: Medicare HMO | Admitting: Family Medicine

## 2010-09-09 ENCOUNTER — Other Ambulatory Visit: Payer: Self-pay | Admitting: Family Medicine

## 2010-09-09 ENCOUNTER — Telehealth: Payer: Self-pay | Admitting: Family Medicine

## 2010-09-09 ENCOUNTER — Encounter: Payer: Self-pay | Admitting: Family Medicine

## 2010-09-09 VITALS — BP 153/69 | HR 59 | Temp 97.9°F | Ht 66.0 in | Wt 138.1 lb

## 2010-09-09 DIAGNOSIS — I10 Essential (primary) hypertension: Secondary | ICD-10-CM

## 2010-09-09 DIAGNOSIS — J984 Other disorders of lung: Secondary | ICD-10-CM

## 2010-09-09 DIAGNOSIS — R911 Solitary pulmonary nodule: Secondary | ICD-10-CM

## 2010-09-09 DIAGNOSIS — R030 Elevated blood-pressure reading, without diagnosis of hypertension: Secondary | ICD-10-CM

## 2010-09-09 DIAGNOSIS — R9389 Abnormal findings on diagnostic imaging of other specified body structures: Secondary | ICD-10-CM

## 2010-09-09 DIAGNOSIS — C61 Malignant neoplasm of prostate: Secondary | ICD-10-CM

## 2010-09-09 MED ORDER — NEBIVOLOL HCL 5 MG PO TABS: 2.5000 mg | ORAL_TABLET | Freq: Every day | ORAL | Status: DC

## 2010-09-09 NOTE — Telephone Encounter (Signed)
Patient called to inform Dr. Abner Greenspan that his appointment to have a chest x-ray was not made at [facility]. I did not see a referral for this in the patient chart or work que. Please advise as to what you would like the patient to do.

## 2010-09-09 NOTE — Patient Instructions (Signed)

## 2010-09-09 NOTE — Telephone Encounter (Signed)
Ok, this order has been placed--PM

## 2010-09-09 NOTE — Telephone Encounter (Signed)
I have reviewed his chart and I cannot find any mention of him needing a chest x-ray or Dr. Abner Greenspan ordering one. Maybe he's thinking of a different doctor?

## 2010-09-09 NOTE — Progress Notes (Signed)
Patient called b/c CXR that was supposed to have been ordered at [facility] was not ordered. Pt. Says the CXR is needed to f/u abnl CXR done by a different MD about 6mo ago. Order for CXR placed now.--PM

## 2010-09-09 NOTE — Telephone Encounter (Signed)
Patient has been notified and orders have been faxed to [facility].

## 2010-09-12 ENCOUNTER — Encounter: Payer: Self-pay | Admitting: Family Medicine

## 2010-09-13 NOTE — Assessment & Plan Note (Signed)
Patient is now following with urology again. They have restarted treatments although he is unsure with exactly what. Patient is reporting he had an abnormal CXR 6 months ago which required some follow up so he is requesting that be ordered. That is not available to [place] so when we ordered th CXR it came back normal but the radiologist at [facility] reports that he had a CT of chest in 11/11 which did have some abnormal findings and does need to be repeated so we will order that for follow up

## 2010-09-13 NOTE — Assessment & Plan Note (Signed)
He is under a great deal of stress with his wife's drinking and his prostate CA so his BP is trending up. Will start him on Bystolic 5 mg tab 1/2 tab po daily, samples given, recheck bp in 1 month or as needed

## 2010-09-13 NOTE — Progress Notes (Signed)
Sergio Anderson [id number] 1926/05/26 09/13/2010      Progress Note-Follow Up  Subjective  Chief Complaint  Chief Complaint  Patient presents with  . Follow-up    2 month follow up    HPI  Patient is in today with his wife and very stressed over the fact that she continues to drink heavily. He has reestablished care with a urologist and is receiving active treatments again once more. No new urologic complaints. He is tired but denies any cp/palp/sob/fevers/chills/congestion/GI c/o.  Past Medical History  Diagnosis Date  . SKIN CANCER, HX OF 03/01/2010  . PUD, HX OF 03/01/2010  . PROSTATITIS, ACUTE 04/01/2010  . PROSTATE CANCER 03/01/2010  . Nocturia 03/01/2010  . HYPERLIPIDEMIA 03/01/2010  . ELEVATED BP READING WITHOUT DX HYPERTENSION 03/01/2010  . COLONIC POLYPS, HX OF 03/01/2010  . CATARACT EXTRACTIONS, BILATERAL, HX OF 03/01/2010  . BACK PAIN, LUMBAR, WITH RADICULOPATHY 04/01/2010    Past Surgical History  Procedure Date  . Abdominal surgery 1960    Correction of PUD and hiatal hernia  . Tonsillectomy and adenoidectomy   . Colonoscopy w/ biopsies and polypectomy 1980's    several, benign  . Mose surgery to scalp     Family History  Problem Relation Age of Onset  . Stroke Mother   . Hip fracture Mother   . Lung disease Father     smoker  . Cancer Sister     lung/ smoker  . Heart disease Brother   . Heart attack Brother   . Alcohol abuse Maternal Grandfather   . Cancer Paternal Grandmother     colon  . Pneumonia Brother   . Alcohol abuse Brother   . Achalasia Son     History   Social History  . Marital Status: Married    Spouse Name: N/A    Number of Children: N/A  . Years of Education: N/A   Occupational History  . Not on file.   Social History Main Topics  . Smoking status: Never Smoker   . Smokeless tobacco: Not on file  . Alcohol Use: No  . Drug Use:   . Sexually Active:    Other Topics Concern  . Not on file   Social History Narrative  . No  narrative on file    Current Outpatient Prescriptions on File Prior to Visit  Medication Sig Dispense Refill  . aspirin 200 MG suppository Place 200 mg rectally at bedtime.        . niacin (NIASPAN) 500 MG CR tablet Take 1 tablet (500 mg total) by mouth at bedtime. Take an aspirin 1 hour prior  30 tablet  5  . Probiotic Product (ALIGN) 4 MG CAPS Take by mouth. 1 capsule po daily prn when taking antibiotics       . simvastatin (ZOCOR) 20 MG tablet Take 1 tablet (20 mg total) by mouth at bedtime.  30 tablet  5  . Tamsulosin HCl (FLOMAX) 0.4 MG CAPS Take 1 capsule (0.4 mg total) by mouth daily.  30 capsule  11    Allergies  Allergen Reactions  . Rofecoxib     Review of Systems  Review of Systems  Constitutional: Negative for fever and malaise/fatigue.  HENT: Negative for congestion.   Eyes: Negative for discharge.  Respiratory: Negative for shortness of breath.   Cardiovascular: Negative for chest pain, palpitations and leg swelling.  Gastrointestinal: Negative for nausea, abdominal pain and diarrhea.  Genitourinary: Positive for frequency. Negative for dysuria.  Musculoskeletal: Negative for  falls.  Skin: Negative for rash.  Neurological: Negative for loss of consciousness and headaches.  Endo/Heme/Allergies: Negative for polydipsia.  Psychiatric/Behavioral: Negative for depression and suicidal ideas. The patient is not nervous/anxious and does not have insomnia.     Objective  BP 153/69  Pulse 59  Temp(Src) 97.9 F (36.6 C) (Oral)  Ht 5\' 6"  (1.676 m)  Wt 138 lb 1.9 oz (62.651 kg)  BMI 22.29 kg/m2  SpO2 99%  Physical Exam  Physical Exam  Constitutional: He is oriented to person, place, and time and well-developed, well-nourished, and in no distress. No distress.       frail  HENT:  Head: Normocephalic and atraumatic.  Eyes: Conjunctivae are normal.  Neck: Neck supple. No thyromegaly present.  Cardiovascular: Normal rate, regular rhythm and normal heart sounds.     Pulmonary/Chest: Effort normal and breath sounds normal. No respiratory distress.  Abdominal: He exhibits no distension and no mass. There is no tenderness.  Musculoskeletal: He exhibits no edema.  Neurological: He is alert and oriented to person, place, and time.  Skin: Skin is warm.  Psychiatric: Memory, affect and judgment normal.    No results found for this basename: TSH   Lab Results  Component Value Date   WBC 11.1* 05/20/2010   HGB 12.7* 05/20/2010   HCT 38.2* 05/20/2010   MCV 89.2 05/20/2010   PLT 332.0 05/20/2010   Lab Results  Component Value Date   CREATININE 1.4 05/20/2010   BUN 25* 05/20/2010   NA 141 05/20/2010   K 5.4* 05/20/2010   CL 107 05/20/2010   CO2 26 05/20/2010   Lab Results  Component Value Date   ALT 8 05/20/2010   AST 13 05/20/2010   ALKPHOS 84 05/20/2010   BILITOT 0.5 05/20/2010   Lab Results  Component Value Date   CHOL 132 05/20/2010   Lab Results  Component Value Date   HDL 33.10* 05/20/2010   Lab Results  Component Value Date   LDLCALC 76 05/20/2010   Lab Results  Component Value Date   TRIG 114.0 05/20/2010   Lab Results  Component Value Date   CHOLHDL 4 05/20/2010     Assessment & Plan  PROSTATE CANCER Patient is now following with urology again. They have restarted treatments although he is unsure with exactly what. Patient is reporting he had an abnormal CXR 6 months ago which required some follow up so he is requesting that be ordered. That is not available to [place] so when we ordered th CXR it came back normal but the radiologist at [facility] reports that he had a CT of chest in 11/11 which did have some abnormal findings and does need to be repeated so we will order that for follow up  ELEVATED BP READING WITHOUT DX HYPERTENSION He is under a great deal of stress with his wife's drinking and his prostate CA so his BP is trending up. Will start him on Bystolic 5 mg tab 1/2 tab po daily, samples given, recheck bp in 1 month or as  needed  Pulmonary nodule CT from November 2011 is obtained which shows some pulmonary nodules needing surveillance will order a CT chest to reevaluate

## 2010-09-14 ENCOUNTER — Encounter: Payer: Self-pay | Admitting: Family Medicine

## 2010-09-14 DIAGNOSIS — R911 Solitary pulmonary nodule: Secondary | ICD-10-CM

## 2010-09-14 HISTORY — DX: Solitary pulmonary nodule: R91.1

## 2010-09-14 NOTE — Assessment & Plan Note (Signed)
CT from November 2011 is obtained which shows some pulmonary nodules needing surveillance will order a CT chest to reevaluate

## 2010-09-21 ENCOUNTER — Telehealth: Payer: Self-pay

## 2010-09-21 NOTE — Telephone Encounter (Signed)
MD would like to refer pt to Pulmonary for evaluatation of lung disease- possible infection? And a referal to Vascular surgery for evaluation of aortal/ ascending enlarging. Left a message for patient to return my call.

## 2010-09-23 NOTE — Telephone Encounter (Signed)
I called patient and can't understand what he is saying and he put the phone down to I guess get someone else and I sat on the phone for 6 minutes and no one ever came to the phone will mail letter to have patient call our office.

## 2010-09-30 ENCOUNTER — Ambulatory Visit (INDEPENDENT_AMBULATORY_CARE_PROVIDER_SITE_OTHER): Payer: Medicare Other | Admitting: Family Medicine

## 2010-09-30 ENCOUNTER — Encounter: Payer: Self-pay | Admitting: Family Medicine

## 2010-09-30 VITALS — BP 178/75 | HR 50 | Temp 98.1°F | Ht 66.0 in | Wt 143.8 lb

## 2010-09-30 DIAGNOSIS — I499 Cardiac arrhythmia, unspecified: Secondary | ICD-10-CM

## 2010-09-30 DIAGNOSIS — E785 Hyperlipidemia, unspecified: Secondary | ICD-10-CM

## 2010-09-30 DIAGNOSIS — N189 Chronic kidney disease, unspecified: Secondary | ICD-10-CM

## 2010-09-30 DIAGNOSIS — Z23 Encounter for immunization: Secondary | ICD-10-CM

## 2010-09-30 DIAGNOSIS — I10 Essential (primary) hypertension: Secondary | ICD-10-CM

## 2010-09-30 DIAGNOSIS — D72829 Elevated white blood cell count, unspecified: Secondary | ICD-10-CM

## 2010-09-30 DIAGNOSIS — C61 Malignant neoplasm of prostate: Secondary | ICD-10-CM

## 2010-09-30 LAB — RENAL FUNCTION PANEL
Albumin: 3.8 g/dL (ref 3.5–5.2)
Calcium: 8.7 mg/dL (ref 8.4–10.5)
Chloride: 111 mEq/L (ref 96–112)
Glucose, Bld: 98 mg/dL (ref 70–99)
Potassium: 5.1 mEq/L (ref 3.5–5.1)

## 2010-09-30 LAB — CBC WITH DIFFERENTIAL/PLATELET
Basophils Relative: 0.5 % (ref 0.0–3.0)
Eosinophils Absolute: 0.3 10*3/uL (ref 0.0–0.7)
Hemoglobin: 11.4 g/dL — ABNORMAL LOW (ref 13.0–17.0)
MCHC: 33 g/dL (ref 30.0–36.0)
MCV: 93.6 fl (ref 78.0–100.0)
Monocytes Absolute: 0.8 10*3/uL (ref 0.1–1.0)
Neutro Abs: 6.7 10*3/uL (ref 1.4–7.7)
RBC: 3.71 Mil/uL — ABNORMAL LOW (ref 4.22–5.81)

## 2010-09-30 MED ORDER — PNEUMOCOCCAL 13-VAL CONJ VACC IM SUSP: 0.5000 mL | Freq: Once | INTRAMUSCULAR | Status: DC

## 2010-09-30 MED ORDER — SIMVASTATIN 20 MG PO TABS: 20.0000 mg | ORAL_TABLET | Freq: Every day | ORAL | Status: DC

## 2010-09-30 MED ORDER — MEDROXYPROGESTERONE ACETATE 5 MG PO TABS: 5.0000 mg | ORAL_TABLET | Freq: Every day | ORAL | Status: DC

## 2010-09-30 MED ORDER — TAMSULOSIN HCL 0.4 MG PO CAPS: 0.4000 mg | ORAL_CAPSULE | Freq: Every day | ORAL | Status: DC

## 2010-09-30 MED ORDER — NIACIN ER (ANTIHYPERLIPIDEMIC) 500 MG PO TBCR: 500.0000 mg | EXTENDED_RELEASE_TABLET | Freq: Every day | ORAL | Status: DC

## 2010-09-30 NOTE — Patient Instructions (Signed)
Hypertension (High Blood Pressure) As your heart beats, it forces blood through your arteries. This force is your blood pressure. If the pressure is too high, it is called hypertension (HTN) or high blood pressure. HTN is dangerous because you may have it and not know it. High blood pressure may mean that your heart has to work harder to pump blood. Your arteries may be narrow or stiff. The extra work puts you at risk for heart disease, stroke, and other problems.  Blood pressure consists of two numbers, a higher number over a lower, 110/72, for example. It is stated as "110 over 72." The ideal is below 120 for the top number (systolic) and under 80 for the bottom (diastolic). Write down your blood pressure today. You should pay close attention to your blood pressure if you have certain conditions such as:  Heart failure.  Prior heart attack.   Diabetes   Chronic kidney disease.   Prior stroke.   Multiple risk factors for heart disease.   To see if you have HTN, your blood pressure should be measured while you are seated with your arm held at the level of the heart. It should be measured at least twice. A one-time elevated blood pressure reading (especially in the Emergency Department) does not mean that you need treatment. There may be conditions in which the blood pressure is different between your right and left arms. It is important to see your caregiver soon for a recheck. Most people have essential hypertension which means that there is not a specific cause. This type of high blood pressure may be lowered by changing lifestyle factors such as:  Stress.  Smoking.   Lack of exercise.   Excessive weight.  Drug/tobacco/alcohol use.   Eating less salt.   Most people do not have symptoms from high blood pressure until it has caused damage to the body. Effective treatment can often prevent, delay or reduce that damage. TREATMENT Treatment for high blood pressure, when a cause has been  identified, is directed at the cause. There are a large number of medications to treat HTN. These fall into several categories, and your caregiver will help you select the medicines that are best for you. Medications may have side effects. You should review side effects with your caregiver. If your blood pressure stays high after you have made lifestyle changes or started on medicines,   Your medication(s) may need to be changed.   Other problems may need to be addressed.   Be certain you understand your prescriptions, and know how and when to take your medicine.   Be sure to follow up with your caregiver within the time frame advised (usually within two weeks) to have your blood pressure rechecked and to review your medications.   If you are taking more than one medicine to lower your blood pressure, make sure you know how and at what times they should be taken. Taking two medicines at the same time can result in blood pressure that is too low.  SEEK IMMEDIATE MEDICAL CARE IF YOU DEVELOP:  A severe headache, blurred or changing vision, or confusion.   Unusual weakness or numbness, or a faint feeling.   Severe chest or abdominal pain, vomiting, or breathing problems.  MAKE SURE YOU:   Understand these instructions.   Will watch your condition.   Will get help right away if you are not doing well or get worse.  Document Released: 01/10/2005 Document Re-Released: 06/30/2009 [employer] Patient Information 2011 [employer].    Check Blood Pressure and Pulse daily after resting 10 to 15 minutes and record, bring numbers in to next visit.  Call [facility] and report the increased pain and ask for a physician change

## 2010-10-04 ENCOUNTER — Encounter: Payer: Self-pay | Admitting: Family Medicine

## 2010-10-04 MED ORDER — AMLODIPINE BESYLATE 2.5 MG PO TABS: 2.5000 mg | ORAL_TABLET | Freq: Every day | ORAL | Status: DC

## 2010-10-05 ENCOUNTER — Encounter: Payer: Self-pay | Admitting: Family Medicine

## 2010-10-05 DIAGNOSIS — N189 Chronic kidney disease, unspecified: Secondary | ICD-10-CM | POA: Insufficient documentation

## 2010-10-05 DIAGNOSIS — I10 Essential (primary) hypertension: Secondary | ICD-10-CM | POA: Insufficient documentation

## 2010-10-05 HISTORY — DX: Essential (primary) hypertension: I10

## 2010-10-05 NOTE — Progress Notes (Signed)
Sergio Anderson [id number] 1926-07-06 10/05/2010      Progress Note-Follow Up  Subjective  Chief Complaint  Chief Complaint  Patient presents with  . Follow-up    3 week follow up    HPI  Patient is a 77-year-old male in today for followup on blood pressure and multiple other medical problems voiding Bystolic, chest pain, palpitations, recent illness, fevers, chills, GI complaints. Genitourinary frequency and urgency. Intermittent testicular pain as well. Does agree to followup with urology and will call for an appointment. Requesting refills on hischolesterol medicines including his simvastatin and niacin.  Past Medical History  Diagnosis Date  . SKIN CANCER, HX OF 03/01/2010  . PUD, HX OF 03/01/2010  . PROSTATITIS, ACUTE 04/01/2010  . PROSTATE CANCER 03/01/2010  . Nocturia 03/01/2010  . HYPERLIPIDEMIA 03/01/2010  . ELEVATED BP READING WITHOUT DX HYPERTENSION 03/01/2010  . COLONIC POLYPS, HX OF 03/01/2010  . CATARACT EXTRACTIONS, BILATERAL, HX OF 03/01/2010  . BACK PAIN, LUMBAR, WITH RADICULOPATHY 04/01/2010  . Pulmonary nodule 09/14/2010  . HTN (hypertension) 10/05/2010    Past Surgical History  Procedure Date  . Abdominal surgery 1960    Correction of PUD and hiatal hernia  . Tonsillectomy and adenoidectomy   . Colonoscopy w/ biopsies and polypectomy 1980's    several, benign  . Mose surgery to scalp     Family History  Problem Relation Age of Onset  . Stroke Mother   . Hip fracture Mother   . Lung disease Father     smoker  . Cancer Sister     lung/ smoker  . Heart disease Brother   . Heart attack Brother   . Alcohol abuse Maternal Grandfather   . Cancer Paternal Grandmother     colon  . Pneumonia Brother   . Alcohol abuse Brother   . Achalasia Son     History   Social History  . Marital Status: Married    Spouse Name: N/A    Number of Children: N/A  . Years of Education: N/A   Occupational History  . Not on file.   Social History Main Topics  . Smoking status:  Never Smoker   . Smokeless tobacco: Not on file  . Alcohol Use: No  . Drug Use:   . Sexually Active:    Other Topics Concern  . Not on file   Social History Narrative  . No narrative on file    Current Outpatient Prescriptions on File Prior to Visit  Medication Sig Dispense Refill  . nebivolol (BYSTOLIC) 5 MG tablet Take 0.5 tablets (2.5 mg total) by mouth daily.  10 tablet  0   No current facility-administered medications on file prior to visit.    Allergies  Allergen Reactions  . Rofecoxib     Review of Systems  Review of Systems  Constitutional: Negative for fever and malaise/fatigue.  HENT: Negative for congestion.   Eyes: Negative for discharge.  Respiratory: Negative for shortness of breath.   Cardiovascular: Negative for chest pain, palpitations and leg swelling.  Gastrointestinal: Negative for nausea, abdominal pain and diarrhea.  Genitourinary: Positive for urgency and frequency. Negative for dysuria, hematuria and flank pain.  Musculoskeletal: Negative for falls.  Skin: Negative for rash.  Neurological: Negative for loss of consciousness and headaches.  Endo/Heme/Allergies: Negative for polydipsia.  Psychiatric/Behavioral: Negative for depression and suicidal ideas. The patient is not nervous/anxious and does not have insomnia.     Objective  BP 178/75  Pulse 50  Temp(Src) 98.1 F (36.7  C) (Oral)  Ht 5\' 6"  (1.676 m)  Wt 143 lb 12.8 oz (65.227 kg)  BMI 23.21 kg/m2  SpO2 99%  Physical Exam  Physical Exam  Constitutional: He is oriented to person, place, and time and well-developed, well-nourished, and in no distress. No distress.  HENT:  Head: Normocephalic and atraumatic.  Eyes: Conjunctivae are normal.  Neck: Neck supple. No thyromegaly present.  Cardiovascular: Normal rate, regular rhythm and normal heart sounds.   No murmur heard. Pulmonary/Chest: Effort normal and breath sounds normal. No respiratory distress.  Abdominal: He exhibits no  distension and no mass. There is no tenderness.  Musculoskeletal: He exhibits no edema.  Neurological: He is alert and oriented to person, place, and time.  Skin: Skin is warm.  Psychiatric: Memory, affect and judgment normal.    No results found for this basename: TSH   Lab Results  Component Value Date   WBC 9.9 09/30/2010   HGB 11.4* 09/30/2010   HCT 34.7* 09/30/2010   MCV 93.6 09/30/2010   PLT 268.0 09/30/2010   Lab Results  Component Value Date   CREATININE 1.7* 09/30/2010   BUN 31* 09/30/2010   NA 142 09/30/2010   K 5.1 09/30/2010   CL 111 09/30/2010   CO2 24 09/30/2010   Lab Results  Component Value Date   ALT 8 05/20/2010   AST 13 05/20/2010   ALKPHOS 84 05/20/2010   BILITOT 0.5 05/20/2010   Lab Results  Component Value Date   CHOL 132 05/20/2010   Lab Results  Component Value Date   HDL 33.10* 05/20/2010   Lab Results  Component Value Date   LDLCALC 76 05/20/2010   Lab Results  Component Value Date   TRIG 114.0 05/20/2010   Lab Results  Component Value Date   CHOLHDL 4 05/20/2010     Assessment & Plan  PROSTATE CANCER Patient hs finally agreed to continue his care with urology and is willing to call and arrange his next follow up appt.  HTN (hypertension) Will continue Bystolic and add Amlodipine and reassess at next visit.  HYPERLIPIDEMIA Given refills on his Simvastatin and Niacin today  CRF (chronic renal failure) Creatinine up to 1.7 today will not initiate an ARB or AceI at this time

## 2010-10-05 NOTE — Assessment & Plan Note (Signed)
Patient hs finally agreed to continue his care with urology and is willing to call and arrange his next follow up appt.

## 2010-10-05 NOTE — Assessment & Plan Note (Signed)
Creatinine up to 1.7 today will not initiate an ARB or AceI at this time

## 2010-10-05 NOTE — Assessment & Plan Note (Signed)
Will continue Bystolic and add Amlodipine and reassess at next visit.

## 2010-10-05 NOTE — Assessment & Plan Note (Signed)
Given refills on his Simvastatin and Niacin today

## 2010-10-14 ENCOUNTER — Ambulatory Visit (INDEPENDENT_AMBULATORY_CARE_PROVIDER_SITE_OTHER): Payer: Medicare Other | Admitting: Family Medicine

## 2010-10-14 ENCOUNTER — Encounter: Payer: Self-pay | Admitting: Family Medicine

## 2010-10-14 VITALS — BP 132/72 | HR 67 | Temp 98.3°F | Ht 66.0 in | Wt 139.8 lb

## 2010-10-14 DIAGNOSIS — C61 Malignant neoplasm of prostate: Secondary | ICD-10-CM

## 2010-10-14 DIAGNOSIS — I10 Essential (primary) hypertension: Secondary | ICD-10-CM

## 2010-10-14 LAB — PSA: PSA: 1.88 ng/mL (ref 0.10–4.00)

## 2010-10-14 MED ORDER — AMLODIPINE BESYLATE 2.5 MG PO TABS: 2.5000 mg | ORAL_TABLET | Freq: Every day | ORAL | Status: DC

## 2010-10-14 NOTE — Progress Notes (Signed)
Sergio Anderson [id number] 1926-09-20 10/14/2010      Progress Note-Follow Up  Subjective  Chief Complaint  Chief Complaint  Patient presents with  . Follow-up    2 week follow up    HPI  Patient is a 77 year old Caucasian male who is in today for follow up of his blood pressure. At his last visit he started amlodipine 2.5 mg daily and has been tolerating well. He stopped it is solid. He denies any fatigue, chest pain, palpitations, shortness of breath, GI or GU concerns today. He reports overall he feels somewhat better. Continues to do a lot of stress with his prostate cancer diagnosis as well as his difficulties with his alcoholic wife and her family overall he is doing better as his wife drinks less alcohol.  Past Medical History  Diagnosis Date  . SKIN CANCER, HX OF 03/01/2010  . PUD, HX OF 03/01/2010  . PROSTATITIS, ACUTE 04/01/2010  . PROSTATE CANCER 03/01/2010  . Nocturia 03/01/2010  . HYPERLIPIDEMIA 03/01/2010  . ELEVATED BP READING WITHOUT DX HYPERTENSION 03/01/2010  . COLONIC POLYPS, HX OF 03/01/2010  . CATARACT EXTRACTIONS, BILATERAL, HX OF 03/01/2010  . BACK PAIN, LUMBAR, WITH RADICULOPATHY 04/01/2010  . Pulmonary nodule 09/14/2010  . HTN (hypertension) 10/05/2010    Past Surgical History  Procedure Date  . Abdominal surgery 1960    Correction of PUD and hiatal hernia  . Tonsillectomy and adenoidectomy   . Colonoscopy w/ biopsies and polypectomy 1980's    several, benign  . Mose surgery to scalp     Family History  Problem Relation Age of Onset  . Stroke Mother   . Hip fracture Mother   . Lung disease Father     smoker  . Cancer Sister     lung/ smoker  . Heart disease Brother   . Heart attack Brother   . Alcohol abuse Maternal Grandfather   . Cancer Paternal Grandmother     colon  . Pneumonia Brother   . Alcohol abuse Brother   . Achalasia Son     History   Social History  . Marital Status: Married    Spouse Name: N/A    Number of Children: N/A  . Years of  Education: N/A   Occupational History  . Not on file.   Social History Main Topics  . Smoking status: Never Smoker   . Smokeless tobacco: Never Used  . Alcohol Use: No  . Drug Use: Not on file  . Sexually Active: Not on file   Other Topics Concern  . Not on file   Social History Narrative  . No narrative on file    Current Outpatient Prescriptions on File Prior to Visit  Medication Sig Dispense Refill  . acetaminophen (TYLENOL) 100 MG/ML solution Take 200 mg/kg by mouth as needed.        . calcium citrate-vitamin D 200-200 MG-UNIT TABS Take 3 tablets by mouth daily.        . medroxyPROGESTERone (PROVERA) 5 MG tablet Take 1 tablet (5 mg total) by mouth daily.  30 tablet  0  . nebivolol (BYSTOLIC) 5 MG tablet Take 0.5 tablets (2.5 mg total) by mouth daily.  10 tablet  0  . niacin (NIASPAN) 500 MG CR tablet Take 1 tablet (500 mg total) by mouth at bedtime. Take an aspirin 1 hour prior  30 tablet  3  . simvastatin (ZOCOR) 20 MG tablet Take 1 tablet (20 mg total) by mouth at bedtime.  30 tablet  3  . Tamsulosin HCl (FLOMAX) 0.4 MG CAPS Take 1 capsule (0.4 mg total) by mouth daily.  30 capsule  3  . triamcinolone (KENALOG) 0.1 % cream         Allergies  Allergen Reactions  . Rofecoxib     Review of Systems  Review of Systems  Constitutional: Negative for fever and malaise/fatigue.  HENT: Negative for congestion.   Eyes: Negative for discharge.  Respiratory: Negative for shortness of breath.   Cardiovascular: Negative for chest pain, palpitations and leg swelling.  Gastrointestinal: Negative for nausea, abdominal pain and diarrhea.  Genitourinary: Negative for dysuria.  Musculoskeletal: Negative for falls.  Skin: Negative for rash.  Neurological: Negative for loss of consciousness and headaches.  Endo/Heme/Allergies: Negative for polydipsia.  Psychiatric/Behavioral: Negative for depression and suicidal ideas. The patient is not nervous/anxious and does not have insomnia.       Objective  BP 132/72  Pulse 67  Temp(Src) 98.3 F (36.8 C) (Oral)  Ht 5\' 6"  (1.676 m)  Wt 139 lb 12.8 oz (63.413 kg)  BMI 22.56 kg/m2  SpO2 99%  Physical Exam  Physical Exam  Constitutional: He is oriented to person, place, and time and well-developed, well-nourished, and in no distress. No distress.  HENT:  Head: Normocephalic and atraumatic.  Eyes: Conjunctivae are normal.  Neck: Neck supple. No thyromegaly present.  Cardiovascular: Normal rate and regular rhythm.   Murmur heard.      2/6 systolic murmur  Pulmonary/Chest: Effort normal and breath sounds normal. No respiratory distress.  Abdominal: He exhibits no distension and no mass. There is no tenderness.  Musculoskeletal: He exhibits no edema.  Neurological: He is alert and oriented to person, place, and time.  Skin: Skin is warm.  Psychiatric: Memory, affect and judgment normal.    No results found for this basename: TSH   Lab Results  Component Value Date   WBC 9.9 09/30/2010   HGB 11.4* 09/30/2010   HCT 34.7* 09/30/2010   MCV 93.6 09/30/2010   PLT 268.0 09/30/2010   Lab Results  Component Value Date   CREATININE 1.7* 09/30/2010   BUN 31* 09/30/2010   NA 142 09/30/2010   K 5.1 09/30/2010   CL 111 09/30/2010   CO2 24 09/30/2010   Lab Results  Component Value Date   ALT 8 05/20/2010   AST 13 05/20/2010   ALKPHOS 84 05/20/2010   BILITOT 0.5 05/20/2010   Lab Results  Component Value Date   CHOL 132 05/20/2010   Lab Results  Component Value Date   HDL 33.10* 05/20/2010   Lab Results  Component Value Date   LDLCALC 76 05/20/2010   Lab Results  Component Value Date   TRIG 114.0 05/20/2010   Lab Results  Component Value Date   CHOLHDL 4 05/20/2010     Assessment & Plan  HTN (hypertension) Improved on Amlodipine 2.5mg  daily, has stopped Bystolic  PROSTATE CANCER Patient has agreed to continue to follow with urology but does not have another appt til December. He is requesting a recheck on his PSA level today  due to his hi anxiey level regarding his diagnosis. Will order, no changes to symptoms noted today

## 2010-10-14 NOTE — Patient Instructions (Signed)

## 2010-10-14 NOTE — Assessment & Plan Note (Signed)
Improved on Amlodipine 2.5mg  daily, has stopped [employer]

## 2010-10-14 NOTE — Progress Notes (Signed)
Patient informed and I mailed results to patient

## 2010-10-14 NOTE — Assessment & Plan Note (Signed)
Patient has agreed to continue to follow with urology but does not have another appt til December. He is requesting a recheck on his PSA level today due to his hi anxiey level regarding his diagnosis. Will order, no changes to symptoms noted today

## 2010-11-04 ENCOUNTER — Other Ambulatory Visit: Payer: Self-pay

## 2010-11-04 DIAGNOSIS — I10 Essential (primary) hypertension: Secondary | ICD-10-CM

## 2010-11-04 MED ORDER — AMLODIPINE BESYLATE 2.5 MG PO TABS: 2.5000 mg | ORAL_TABLET | Freq: Every day | ORAL | Status: DC

## 2010-11-04 MED ORDER — MEDROXYPROGESTERONE ACETATE 5 MG PO TABS: 5.0000 mg | ORAL_TABLET | Freq: Every day | ORAL | Status: DC

## 2010-11-18 ENCOUNTER — Encounter: Payer: Self-pay | Admitting: Family Medicine

## 2010-11-18 ENCOUNTER — Ambulatory Visit: Payer: Medicare Other | Admitting: Family Medicine

## 2010-11-18 ENCOUNTER — Ambulatory Visit (INDEPENDENT_AMBULATORY_CARE_PROVIDER_SITE_OTHER): Payer: Medicare HMO | Admitting: Family Medicine

## 2010-11-18 DIAGNOSIS — IMO0002 Reserved for concepts with insufficient information to code with codable children: Secondary | ICD-10-CM

## 2010-11-18 DIAGNOSIS — I10 Essential (primary) hypertension: Secondary | ICD-10-CM

## 2010-11-18 DIAGNOSIS — E785 Hyperlipidemia, unspecified: Secondary | ICD-10-CM

## 2010-11-18 DIAGNOSIS — C61 Malignant neoplasm of prostate: Secondary | ICD-10-CM

## 2010-11-18 NOTE — Patient Instructions (Signed)

## 2010-11-21 ENCOUNTER — Encounter: Payer: Self-pay | Admitting: Family Medicine

## 2010-11-21 NOTE — Assessment & Plan Note (Signed)
Tolerating Zocor and Niacin

## 2010-11-21 NOTE — Progress Notes (Signed)
Sergio Anderson [id number] 1926/12/13 11/21/2010      Progress Note-Follow Up  Subjective  Chief Complaint  Chief Complaint  Patient presents with  . Follow-up    5 week follow up    HPI  Patient is an 77 year old Caucasian male in today for followup on her medical problems. Overall he feels well. He denies any recent illness, fevers, chills, chest pain, palpitations, shortness of breath, GI or GU complaints. He reports his back pain has improved greatly since his last visit. Continues to decline flu shot. He is pleased with his wife's improvement and is happy that she stop drinking. He is now following with urology to control his prostate cancer and is having decreased symptoms  Past Medical History  Diagnosis Date  . SKIN CANCER, HX OF 03/01/2010  . PUD, HX OF 03/01/2010  . PROSTATITIS, ACUTE 04/01/2010  . PROSTATE CANCER 03/01/2010  . Nocturia 03/01/2010  . HYPERLIPIDEMIA 03/01/2010  . ELEVATED BP READING WITHOUT DX HYPERTENSION 03/01/2010  . COLONIC POLYPS, HX OF 03/01/2010  . CATARACT EXTRACTIONS, BILATERAL, HX OF 03/01/2010  . BACK PAIN, LUMBAR, WITH RADICULOPATHY 04/01/2010  . Pulmonary nodule 09/14/2010  . HTN (hypertension) 10/05/2010    Past Surgical History  Procedure Date  . Abdominal surgery 1960    Correction of PUD and hiatal hernia  . Tonsillectomy and adenoidectomy   . Colonoscopy w/ biopsies and polypectomy 1980's    several, benign  . Mose surgery to scalp     Family History  Problem Relation Age of Onset  . Stroke Mother   . Hip fracture Mother   . Lung disease Father     smoker  . Cancer Sister     lung/ smoker  . Heart disease Brother   . Heart attack Brother   . Alcohol abuse Maternal Grandfather   . Cancer Paternal Grandmother     colon  . Pneumonia Brother   . Alcohol abuse Brother   . Achalasia Son     History   Social History  . Marital Status: Married    Spouse Name: N/A    Number of Children: N/A  . Years of Education: N/A   Occupational  History  . Not on file.   Social History Main Topics  . Smoking status: Never Smoker   . Smokeless tobacco: Never Used  . Alcohol Use: No  . Drug Use: Not on file  . Sexually Active: Not on file   Other Topics Concern  . Not on file   Social History Narrative  . No narrative on file    Current Outpatient Prescriptions on File Prior to Visit  Medication Sig Dispense Refill  . acetaminophen (TYLENOL) 100 MG/ML solution Take 200 mg/kg by mouth as needed.        Marland Kitchen amLODipine (NORVASC) 2.5 MG tablet Take 1 tablet (2.5 mg total) by mouth daily.  30 tablet  3  . calcium citrate-vitamin D 200-200 MG-UNIT TABS Take 3 tablets by mouth daily.        . medroxyPROGESTERone (PROVERA) 5 MG tablet Take 1 tablet (5 mg total) by mouth daily.  30 tablet  0  . nebivolol (BYSTOLIC) 5 MG tablet Take 0.5 tablets (2.5 mg total) by mouth daily.  10 tablet  0  . niacin (NIASPAN) 500 MG CR tablet Take 1 tablet (500 mg total) by mouth at bedtime. Take an aspirin 1 hour prior  30 tablet  3  . simvastatin (ZOCOR) 20 MG tablet Take 1 tablet (20 mg  total) by mouth at bedtime.  30 tablet  3  . Tamsulosin HCl (FLOMAX) 0.4 MG CAPS Take 1 capsule (0.4 mg total) by mouth daily.  30 capsule  3  . triamcinolone (KENALOG) 0.1 % cream         Allergies  Allergen Reactions  . Rofecoxib     Review of Systems  Review of Systems  Constitutional: Negative for fever and malaise/fatigue.  HENT: Negative for congestion.   Eyes: Negative for discharge.  Respiratory: Negative for shortness of breath.   Cardiovascular: Negative for chest pain, palpitations and leg swelling.  Gastrointestinal: Negative for nausea, abdominal pain and diarrhea.  Genitourinary: Negative for dysuria.  Musculoskeletal: Negative for falls.  Skin: Negative for rash.  Neurological: Negative for loss of consciousness and headaches.  Endo/Heme/Allergies: Negative for polydipsia.  Psychiatric/Behavioral: Negative for depression and suicidal ideas.  The patient is not nervous/anxious and does not have insomnia.     Objective  BP 124/84  Pulse 73  Temp(Src) 98.6 F (37 C) (Oral)  Ht 5\' 6"  (1.676 m)  Wt 141 lb 6.4 oz (64.139 kg)  BMI 22.82 kg/m2  SpO2 98%  Physical Exam  Physical Exam  Constitutional: He is oriented to person, place, and time and well-developed, well-nourished, and in no distress. No distress.  HENT:  Head: Normocephalic and atraumatic.  Eyes: Conjunctivae are normal.  Neck: Neck supple. No thyromegaly present.  Cardiovascular: Normal rate, regular rhythm and normal heart sounds.   Pulmonary/Chest: Effort normal and breath sounds normal. No respiratory distress.  Abdominal: He exhibits no distension and no mass. There is no tenderness.  Musculoskeletal: He exhibits no edema.  Neurological: He is alert and oriented to person, place, and time.  Skin: Skin is warm.  Psychiatric: Memory, affect and judgment normal.    No results found for this basename: TSH   Lab Results  Component Value Date   WBC 9.9 09/30/2010   HGB 11.4* 09/30/2010   HCT 34.7* 09/30/2010   MCV 93.6 09/30/2010   PLT 268.0 09/30/2010   Lab Results  Component Value Date   CREATININE 1.7* 09/30/2010   BUN 31* 09/30/2010   NA 142 09/30/2010   K 5.1 09/30/2010   CL 111 09/30/2010   CO2 24 09/30/2010   Lab Results  Component Value Date   ALT 8 05/20/2010   AST 13 05/20/2010   ALKPHOS 84 05/20/2010   BILITOT 0.5 05/20/2010   Lab Results  Component Value Date   CHOL 132 05/20/2010   Lab Results  Component Value Date   HDL 33.10* 05/20/2010   Lab Results  Component Value Date   LDLCALC 76 05/20/2010   Lab Results  Component Value Date   TRIG 114.0 05/20/2010   Lab Results  Component Value Date   CHOLHDL 4 05/20/2010     Assessment & Plan  HTN (hypertension) Improved control on repeat check, no changes in meds today  BACK PAIN, LUMBAR, WITH RADICULOPATHY Improved at this time, no change in therapy  PROSTATE CANCER Following with  urology now, psa improved  HYPERLIPIDEMIA Tolerating Zocor and Niacin

## 2010-11-21 NOTE — Assessment & Plan Note (Signed)
Improved control on repeat check, no changes in meds today

## 2010-11-21 NOTE — Assessment & Plan Note (Signed)
Improved at this time, no change in therapy

## 2010-11-21 NOTE — Assessment & Plan Note (Addendum)
Following with urology now, psa improved

## 2010-12-23 ENCOUNTER — Ambulatory Visit (INDEPENDENT_AMBULATORY_CARE_PROVIDER_SITE_OTHER): Payer: Medicare HMO

## 2010-12-23 DIAGNOSIS — Z23 Encounter for immunization: Secondary | ICD-10-CM

## 2011-01-20 ENCOUNTER — Ambulatory Visit: Payer: Medicare HMO | Admitting: Family Medicine

## 2011-02-03 ENCOUNTER — Encounter: Payer: Self-pay | Admitting: Family Medicine

## 2011-02-03 ENCOUNTER — Other Ambulatory Visit: Payer: Self-pay

## 2011-02-03 ENCOUNTER — Ambulatory Visit (INDEPENDENT_AMBULATORY_CARE_PROVIDER_SITE_OTHER): Payer: Medicare Other | Admitting: Family Medicine

## 2011-02-03 VITALS — BP 130/68 | HR 62 | Temp 99.0°F | Wt 145.0 lb

## 2011-02-03 DIAGNOSIS — E785 Hyperlipidemia, unspecified: Secondary | ICD-10-CM

## 2011-02-03 DIAGNOSIS — C61 Malignant neoplasm of prostate: Secondary | ICD-10-CM

## 2011-02-03 DIAGNOSIS — I10 Essential (primary) hypertension: Secondary | ICD-10-CM

## 2011-02-03 MED ORDER — ZOSTER VACCINE LIVE 19400 UNT/0.65ML ~~LOC~~ SOLR: 0.6500 mL | Freq: Once | SUBCUTANEOUS | Status: AC

## 2011-02-03 MED ORDER — ZOSTER VACCINE LIVE 19400 UNT/0.65ML ~~LOC~~ SOLR: 0.6500 mL | Freq: Once | SUBCUTANEOUS | Status: DC

## 2011-02-03 MED ORDER — TAMSULOSIN HCL 0.4 MG PO CAPS: 0.4000 mg | ORAL_CAPSULE | Freq: Every day | ORAL | Status: DC

## 2011-02-03 NOTE — Progress Notes (Signed)
OFFICE NOTE  02/03/2011  CC:  Chief Complaint  Patient presents with  . Follow-up     HPI: Patient is a 78 y.o. Caucasian male pt of Dr. Abner Greenspan who is here for routine hypertension and hyperlipidemia f/u. Compliant with meds, eats prudent diet, stays active. Denies CP, SOB, palpitations, peripheral edema, HA's or abdominal pains or skin changes.  He is happy to report that recent urologic f/u showed a dramatic drop in his PSA from around 14 down to 0.13!  Pertinent PMH:  Past Medical History  Diagnosis Date  . SKIN CANCER, HX OF 03/01/2010  . PUD, HX OF 03/01/2010  . PROSTATITIS, ACUTE 04/01/2010  . PROSTATE CANCER 03/01/2010  . Nocturia 03/01/2010  . HYPERLIPIDEMIA 03/01/2010  . ELEVATED BP READING WITHOUT DX HYPERTENSION 03/01/2010  . COLONIC POLYPS, HX OF 03/01/2010  . CATARACT EXTRACTIONS, BILATERAL, HX OF 03/01/2010  . BACK PAIN, LUMBAR, WITH RADICULOPATHY 04/01/2010  . Pulmonary nodule 09/14/2010  . HTN (hypertension) 10/05/2010   Past surgical, social, and family history reviewed and no changes noted since last office visit.  Pertinent Meds:  Outpatient Prescriptions Prior to Visit  Medication Sig Dispense Refill  . amLODipine (NORVASC) 2.5 MG tablet Take 1 tablet (2.5 mg total) by mouth daily.  30 tablet  3  . calcium citrate-vitamin D 200-200 MG-UNIT TABS Take 3 tablets by mouth daily.        . medroxyPROGESTERone (PROVERA) 5 MG tablet Take 1 tablet (5 mg total) by mouth daily.  30 tablet  0  . nebivolol (BYSTOLIC) 5 MG tablet Take 0.5 tablets (2.5 mg total) by mouth daily.  10 tablet  0  . niacin (NIASPAN) 500 MG CR tablet Take 1 tablet (500 mg total) by mouth at bedtime. Take an aspirin 1 hour prior  30 tablet  3  . simvastatin (ZOCOR) 20 MG tablet Take 1 tablet (20 mg total) by mouth at bedtime.  30 tablet  3  . Tamsulosin HCl (FLOMAX) 0.4 MG CAPS Take 1 capsule (0.4 mg total) by mouth daily.  30 capsule  3  . triamcinolone (KENALOG) 0.1 % cream       . acetaminophen (TYLENOL) 100  MG/ML solution Take 200 mg/kg by mouth as needed.           PE: Blood pressure 162/77, pulse 62, temperature 99 F (37.2 C), temperature source Temporal, weight 145 lb (65.772 kg). Repeat bp in room was 130/68.  Gen: Alert, well appearing.  Patient is oriented to person, place, time, and situation. Pleasant affect, lucid thought and conversation. No further exam today.  IMPRESSION AND PLAN: HTN and mixed hyperlipidemia: Problem stable.  Continue current medications and diet appropriate for this condition.  We have reviewed our general long term plan for this problem and also reviewed symptoms and signs that should prompt the patient to call or return to the office. Hx of prostate cancer: Improved and getting appropriate urologic f/u.  He is UTD on flu vaccine this season and on pneumovax as well. Gave rx for zostavax today after discussion of risks/benefits of this vaccine.  FOLLOW UP: 2-3 mo.

## 2011-03-30 ENCOUNTER — Encounter: Payer: Self-pay | Admitting: Family Medicine

## 2011-03-30 ENCOUNTER — Ambulatory Visit (INDEPENDENT_AMBULATORY_CARE_PROVIDER_SITE_OTHER): Payer: Medicare Other | Admitting: Family Medicine

## 2011-03-30 DIAGNOSIS — I10 Essential (primary) hypertension: Secondary | ICD-10-CM

## 2011-03-30 DIAGNOSIS — L259 Unspecified contact dermatitis, unspecified cause: Secondary | ICD-10-CM

## 2011-03-30 DIAGNOSIS — J329 Chronic sinusitis, unspecified: Secondary | ICD-10-CM | POA: Insufficient documentation

## 2011-03-30 DIAGNOSIS — L309 Dermatitis, unspecified: Secondary | ICD-10-CM

## 2011-03-30 HISTORY — DX: Chronic sinusitis, unspecified: J32.9

## 2011-03-30 HISTORY — DX: Dermatitis, unspecified: L30.9

## 2011-03-30 MED ORDER — CEPHALEXIN 500 MG PO CAPS: 500.0000 mg | ORAL_CAPSULE | Freq: Three times a day (TID) | ORAL | Status: AC

## 2011-03-30 MED ORDER — TRIAMCINOLONE IN ABSORBASE 0.05 % EX OINT: 20.0000 mL | TOPICAL_OINTMENT | Freq: Every day | CUTANEOUS | Status: DC | PRN

## 2011-03-30 MED ORDER — LORATADINE 10 MG PO TABS: 10.0000 mg | ORAL_TABLET | Freq: Every day | ORAL | Status: DC | PRN

## 2011-03-30 MED ORDER — PREDNISONE 20 MG PO TABS: ORAL_TABLET | ORAL | Status: DC

## 2011-03-30 NOTE — Patient Instructions (Signed)

## 2011-03-30 NOTE — Assessment & Plan Note (Signed)
Adequately controlled, no change in meds today 

## 2011-03-30 NOTE — Assessment & Plan Note (Signed)
Keflex 500 mg po tid x 10 days

## 2011-03-30 NOTE — Progress Notes (Signed)
Patient ID: Sergio Anderson, male   DOB: 04-11-1926, 78 y.o.   MRN: [id number] Sergio Anderson [id number] 08-09-1926 03/30/2011      Progress Note-Follow Up  Subjective  Chief Complaint  Chief Complaint  Patient presents with  . infection in jaw    dentist said tooth is okay  . Rash    X every spring, on back and stomach    HPI  Patient is 78 yo caucasian male in today for evaluation of sinusitis and dermatitis. He went to the dentist yesterday because a tooth was hurting along his right upper jaw line. After xray he was told he had a chronic sinusitis and his tooth was OK. Denies any fevers, chills, sore throat, cough, chest pain, palpitations, shortness of breath, GI or GU complaints at today's visit. He is noting also a problem with many years of recurrent dermatitis occurring spring. It's been present since about 3 weeks. It comes across his abdomen and scattered maculopapular lesions. They last for several weeks to resolve. In the past and they've been back he's been on antibiotics and steroids which does help. They've never been able to find the cause and most of the year he is not plagued by this problem.  Past Medical History  Diagnosis Date  . SKIN CANCER, HX OF 03/01/2010  . PUD, HX OF 03/01/2010  . PROSTATITIS, ACUTE 04/01/2010  . PROSTATE CANCER 03/01/2010  . Nocturia 03/01/2010  . HYPERLIPIDEMIA 03/01/2010  . ELEVATED BP READING WITHOUT DX HYPERTENSION 03/01/2010  . COLONIC POLYPS, HX OF 03/01/2010  . CATARACT EXTRACTIONS, BILATERAL, HX OF 03/01/2010  . BACK PAIN, LUMBAR, WITH RADICULOPATHY 04/01/2010  . Pulmonary nodule 09/14/2010  . HTN (hypertension) 10/05/2010  . Sinusitis 03/30/2011  . Dermatitis 03/30/2011    Past Surgical History  Procedure Date  . Abdominal surgery 1960    Correction of PUD and hiatal hernia  . Tonsillectomy and adenoidectomy   . Colonoscopy w/ biopsies and polypectomy 1980's    several, benign  . Mose surgery to scalp     Family History  Problem Relation  Age of Onset  . Stroke Mother   . Hip fracture Mother   . Lung disease Father     smoker  . Cancer Sister     lung/ smoker  . Heart disease Brother   . Heart attack Brother   . Alcohol abuse Maternal Grandfather   . Cancer Paternal Grandmother     colon  . Pneumonia Brother   . Alcohol abuse Brother   . Achalasia Son     History   Social History  . Marital Status: Married    Spouse Name: N/A    Number of Children: N/A  . Years of Education: N/A   Occupational History  . Not on file.   Social History Main Topics  . Smoking status: Never Smoker   . Smokeless tobacco: Never Used  . Alcohol Use: No  . Drug Use: Not on file  . Sexually Active: Not on file   Other Topics Concern  . Not on file   Social History Narrative  . No narrative on file    Current Outpatient Prescriptions on File Prior to Visit  Medication Sig Dispense Refill  . amLODipine (NORVASC) 2.5 MG tablet Take 1 tablet (2.5 mg total) by mouth daily.  30 tablet  3  . niacin (NIASPAN) 500 MG CR tablet Take 1 tablet (500 mg total) by mouth at bedtime. Take an aspirin 1 hour prior  30  tablet  3  . Tamsulosin HCl (FLOMAX) 0.4 MG CAPS Take 1 capsule (0.4 mg total) by mouth daily.  30 capsule  3  . fluorouracil (EFUDEX) 5 % cream Apply topically 2 (two) times daily.      . simvastatin (ZOCOR) 20 MG tablet Take 1 tablet (20 mg total) by mouth at bedtime.  30 tablet  3    Allergies  Allergen Reactions  . Rofecoxib     Review of Systems  Review of Systems  Constitutional: Negative for fever and malaise/fatigue.  HENT: Positive for congestion.   Eyes: Negative for discharge.  Respiratory: Negative for shortness of breath.   Cardiovascular: Negative for chest pain, palpitations and leg swelling.  Gastrointestinal: Negative for nausea, abdominal pain and diarrhea.  Genitourinary: Negative for dysuria.  Musculoskeletal: Negative for falls.  Skin: Positive for itching and rash.  Neurological: Negative for  loss of consciousness and headaches.  Endo/Heme/Allergies: Negative for polydipsia.  Psychiatric/Behavioral: Negative for depression and suicidal ideas. The patient is not nervous/anxious and does not have insomnia.     Objective  BP 142/82  Pulse 69  Temp(Src) 98.1 F (36.7 C) (Temporal)  Ht 5\' 6"  (1.676 m)  Wt 146 lb (66.225 kg)  BMI 23.56 kg/m2  SpO2 100%  Physical Exam  Physical Exam  Constitutional: He is oriented to person, place, and time and well-developed, well-nourished, and in no distress. No distress.  HENT:  Head: Normocephalic and atraumatic.  Eyes: Conjunctivae are normal.  Neck: Neck supple. No thyromegaly present.  Cardiovascular: Normal rate, regular rhythm and normal heart sounds.   No murmur heard. Pulmonary/Chest: Effort normal and breath sounds normal. No respiratory distress.  Abdominal: He exhibits no distension and no mass. There is no tenderness.  Musculoskeletal: He exhibits no edema.  Neurological: He is alert and oriented to person, place, and time.  Skin: Skin is warm. Rash noted. There is erythema.       Diffuse maculopapular rash over abdomen and back, some scabbed with erythematous base.  Psychiatric: Memory, affect and judgment normal.    No results found for this basename: TSH   Lab Results  Component Value Date   WBC 9.9 09/30/2010   HGB 11.4* 09/30/2010   HCT 34.7* 09/30/2010   MCV 93.6 09/30/2010   PLT 268.0 09/30/2010   Lab Results  Component Value Date   CREATININE 1.7* 09/30/2010   BUN 31* 09/30/2010   NA 142 09/30/2010   K 5.1 09/30/2010   CL 111 09/30/2010   CO2 24 09/30/2010   Lab Results  Component Value Date   ALT 8 05/20/2010   AST 13 05/20/2010   ALKPHOS 84 05/20/2010   BILITOT 0.5 05/20/2010   Lab Results  Component Value Date   CHOL 132 05/20/2010   Lab Results  Component Value Date   HDL 33.10* 05/20/2010   Lab Results  Component Value Date   LDLCALC 76 05/20/2010   Lab Results  Component Value Date   TRIG 114.0  05/20/2010   Lab Results  Component Value Date   CHOLHDL 4 05/20/2010     Assessment & Plan  HTN (hypertension) Adequately controlled, no change in meds today  Sinusitis Keflex 500 mg po tid x 10 days  Dermatitis Prednisone, Claritin, Absorbase prn

## 2011-03-30 NOTE — Assessment & Plan Note (Signed)
Prednisone, Claritin, Absorbase prn

## 2011-03-31 ENCOUNTER — Telehealth: Payer: Self-pay

## 2011-03-31 DIAGNOSIS — L309 Dermatitis, unspecified: Secondary | ICD-10-CM

## 2011-03-31 MED ORDER — TRIAMCINOLONE ACETONIDE 0.1 % EX CREA: TOPICAL_CREAM | CUTANEOUS | Status: DC

## 2011-03-31 NOTE — Telephone Encounter (Signed)
Do they have any strength of Triamcinolone and an emmollient base they can mix it in? If not do they have any other steroid they can mix in an emollient base such as clobetasol, etc?

## 2011-03-31 NOTE — Telephone Encounter (Signed)
Triamcinolone is no longer available? Pharmacist would like to know what else MD would like to try? Please advise?

## 2011-03-31 NOTE — Telephone Encounter (Signed)
[facility] can compound cream, but at a different location and it will not be ready until Saturday.  I called pt and he is agreeable to going to [facility].  I called [facility] and per Selena Batten they will get cream ready today.  RX changed to reflect new strength, sig and quantity.  Pt is notified RX at [facility].

## 2011-04-06 ENCOUNTER — Other Ambulatory Visit: Payer: Self-pay

## 2011-04-06 MED ORDER — SIMVASTATIN 20 MG PO TABS: 20.0000 mg | ORAL_TABLET | Freq: Every day | ORAL | Status: DC

## 2011-04-11 ENCOUNTER — Other Ambulatory Visit: Payer: Self-pay

## 2011-04-11 DIAGNOSIS — I10 Essential (primary) hypertension: Secondary | ICD-10-CM

## 2011-04-11 MED ORDER — AMLODIPINE BESYLATE 2.5 MG PO TABS: 2.5000 mg | ORAL_TABLET | Freq: Every day | ORAL | Status: DC

## 2011-05-03 ENCOUNTER — Ambulatory Visit (INDEPENDENT_AMBULATORY_CARE_PROVIDER_SITE_OTHER): Payer: Medicare Other | Admitting: Family Medicine

## 2011-05-03 ENCOUNTER — Encounter: Payer: Self-pay | Admitting: Family Medicine

## 2011-05-03 VITALS — BP 127/66 | HR 90 | Temp 99.1°F | Ht 66.0 in | Wt 144.0 lb

## 2011-05-03 DIAGNOSIS — E785 Hyperlipidemia, unspecified: Secondary | ICD-10-CM

## 2011-05-03 DIAGNOSIS — J329 Chronic sinusitis, unspecified: Secondary | ICD-10-CM

## 2011-05-03 DIAGNOSIS — L259 Unspecified contact dermatitis, unspecified cause: Secondary | ICD-10-CM

## 2011-05-03 DIAGNOSIS — R5383 Other fatigue: Secondary | ICD-10-CM | POA: Insufficient documentation

## 2011-05-03 DIAGNOSIS — R5381 Other malaise: Secondary | ICD-10-CM

## 2011-05-03 DIAGNOSIS — D649 Anemia, unspecified: Secondary | ICD-10-CM

## 2011-05-03 DIAGNOSIS — N189 Chronic kidney disease, unspecified: Secondary | ICD-10-CM

## 2011-05-03 DIAGNOSIS — L309 Dermatitis, unspecified: Secondary | ICD-10-CM

## 2011-05-03 DIAGNOSIS — I10 Essential (primary) hypertension: Secondary | ICD-10-CM

## 2011-05-03 HISTORY — DX: Other fatigue: R53.83

## 2011-05-03 LAB — TSH: TSH: 1.43 u[IU]/mL (ref 0.35–5.50)

## 2011-05-03 LAB — RENAL FUNCTION PANEL
Albumin: 3.6 g/dL (ref 3.5–5.2)
CO2: 21 mEq/L (ref 19–32)
Calcium: 8.8 mg/dL (ref 8.4–10.5)
Creatinine, Ser: 1.7 mg/dL — ABNORMAL HIGH (ref 0.4–1.5)
Glucose, Bld: 84 mg/dL (ref 70–99)

## 2011-05-03 LAB — LIPID PANEL
Cholesterol: 131 mg/dL (ref 0–200)
LDL Cholesterol: 74 mg/dL (ref 0–99)
VLDL: 25 mg/dL (ref 0.0–40.0)

## 2011-05-03 LAB — CBC
Hemoglobin: 11.2 g/dL — ABNORMAL LOW (ref 13.0–17.0)
MCHC: 33.3 g/dL (ref 30.0–36.0)
MCV: 94.1 fl (ref 78.0–100.0)
Platelets: 298 10*3/uL (ref 150.0–400.0)

## 2011-05-03 LAB — HEPATIC FUNCTION PANEL
ALT: 15 U/L (ref 0–53)
AST: 16 U/L (ref 0–37)
Total Bilirubin: 0.2 mg/dL — ABNORMAL LOW (ref 0.3–1.2)
Total Protein: 6.3 g/dL (ref 6.0–8.3)

## 2011-05-03 NOTE — Assessment & Plan Note (Signed)
Tolerating Zocor, will check lipid panel today despite the fact that he has eaten lunch due to his difficulty getting in

## 2011-05-03 NOTE — Progress Notes (Signed)
Patient ID: Sergio Anderson, male   DOB: April 27, 1926, 78 y.o.   MRN: [id number] TEX CONROY [id number] 03-Mar-1926 05/03/2011      Progress Note-Follow Up  Subjective  Chief Complaint  Chief Complaint  Patient presents with  . Follow-up    3 month follow up    HPI  Patient is an 78 year old Caucasian male is in for followup. Overall doing well. He denies any current sinusitis symptoms. His congestion, fevers are greatly improved. His dermatitis is resolved. Triamcinolone has been helpful. No fevers, chills, chest pain, palpitations, shortness of breath, GI or GU complaints are noted. He continues to follow with urology. He is complaining of fatigue but acknowledges he only gets a couple hours of sleep at a time usually 2 different episodes of 2-3 hours interrupted twice a day. This is all secondary to caring for his wife with dementia who is unable to take medications for insomnia due to his fear of her hurting herself or wondering off when she is disoriented  Past Medical History  Diagnosis Date  . SKIN CANCER, HX OF 03/01/2010  . PUD, HX OF 03/01/2010  . PROSTATITIS, ACUTE 04/01/2010  . PROSTATE CANCER 03/01/2010  . Nocturia 03/01/2010  . HYPERLIPIDEMIA 03/01/2010  . ELEVATED BP READING WITHOUT DX HYPERTENSION 03/01/2010  . COLONIC POLYPS, HX OF 03/01/2010  . CATARACT EXTRACTIONS, BILATERAL, HX OF 03/01/2010  . BACK PAIN, LUMBAR, WITH RADICULOPATHY 04/01/2010  . Pulmonary nodule 09/14/2010  . HTN (hypertension) 10/05/2010  . Sinusitis 03/30/2011  . Dermatitis 03/30/2011  . Fatigue 05/03/2011    Past Surgical History  Procedure Date  . Abdominal surgery 1960    Correction of PUD and hiatal hernia  . Tonsillectomy and adenoidectomy   . Colonoscopy w/ biopsies and polypectomy 1980's    several, benign  . Mose surgery to scalp     Family History  Problem Relation Age of Onset  . Stroke Mother   . Hip fracture Mother   . Lung disease Father     smoker  . Cancer Sister     lung/ smoker  .  Heart disease Brother   . Heart attack Brother   . Alcohol abuse Maternal Grandfather   . Cancer Paternal Grandmother     colon  . Pneumonia Brother   . Alcohol abuse Brother   . Achalasia Son     History   Social History  . Marital Status: Married    Spouse Name: N/A    Number of Children: N/A  . Years of Education: N/A   Occupational History  . Not on file.   Social History Main Topics  . Smoking status: Never Smoker   . Smokeless tobacco: Never Used  . Alcohol Use: No  . Drug Use: Not on file  . Sexually Active: Not on file   Other Topics Concern  . Not on file   Social History Narrative  . No narrative on file    Current Outpatient Prescriptions on File Prior to Visit  Medication Sig Dispense Refill  . amLODipine (NORVASC) 2.5 MG tablet Take 1 tablet (2.5 mg total) by mouth daily.  90 tablet  1  . MEGESTROL ACETATE PO Take by mouth.      . niacin (NIASPAN) 500 MG CR tablet Take 1 tablet (500 mg total) by mouth at bedtime. Take an aspirin 1 hour prior  30 tablet  3  . simvastatin (ZOCOR) 20 MG tablet Take 1 tablet (20 mg total) by mouth at bedtime.  30  tablet  8  . Tamsulosin HCl (FLOMAX) 0.4 MG CAPS Take 1 capsule (0.4 mg total) by mouth daily.  30 capsule  3  . triamcinolone cream (KENALOG) 0.1 % Apply topically daily as needed (rash, pruritis).  454 g  1  . fluorouracil (EFUDEX) 5 % cream Apply topically 2 (two) times daily.        Allergies  Allergen Reactions  . Rofecoxib     Review of Systems  Review of Systems  Constitutional: Positive for malaise/fatigue. Negative for fever.  HENT: Negative for congestion.   Eyes: Negative for discharge.  Respiratory: Negative for shortness of breath.   Cardiovascular: Negative for chest pain, palpitations and leg swelling.  Gastrointestinal: Negative for nausea, abdominal pain and diarrhea.  Genitourinary: Negative for dysuria.  Musculoskeletal: Negative for falls.  Skin: Negative for rash.  Neurological:  Negative for loss of consciousness and headaches.  Endo/Heme/Allergies: Negative for polydipsia.  Psychiatric/Behavioral: Negative for depression and suicidal ideas. The patient is not nervous/anxious and does not have insomnia.     Objective  BP 127/66  Pulse 90  Temp(Src) 99.1 F (37.3 C) (Temporal)  Ht 5\' 6"  (1.676 m)  Wt 144 lb (65.318 kg)  BMI 23.24 kg/m2  SpO2 97%  Physical Exam  Physical Exam  Constitutional: He is oriented to person, place, and time and well-developed, well-nourished, and in no distress. No distress.  HENT:  Head: Normocephalic and atraumatic.  Eyes: Conjunctivae are normal.  Neck: Neck supple. No thyromegaly present.  Cardiovascular: Normal rate, regular rhythm and normal heart sounds.   Pulmonary/Chest: Effort normal and breath sounds normal. No respiratory distress.  Abdominal: He exhibits no distension and no mass. There is no tenderness.  Musculoskeletal: He exhibits no edema.  Neurological: He is alert and oriented to person, place, and time.  Skin: Skin is warm.  Psychiatric: Memory, affect and judgment normal.    No results found for this basename: TSH   Lab Results  Component Value Date   WBC 9.9 09/30/2010   HGB 11.4* 09/30/2010   HCT 34.7* 09/30/2010   MCV 93.6 09/30/2010   PLT 268.0 09/30/2010   Lab Results  Component Value Date   CREATININE 1.7* 09/30/2010   BUN 31* 09/30/2010   NA 142 09/30/2010   K 5.1 09/30/2010   CL 111 09/30/2010   CO2 24 09/30/2010   Lab Results  Component Value Date   ALT 8 05/20/2010   AST 13 05/20/2010   ALKPHOS 84 05/20/2010   BILITOT 0.5 05/20/2010   Lab Results  Component Value Date   CHOL 132 05/20/2010   Lab Results  Component Value Date   HDL 33.10* 05/20/2010   Lab Results  Component Value Date   LDLCALC 76 05/20/2010   Lab Results  Component Value Date   TRIG 114.0 05/20/2010   Lab Results  Component Value Date   CHOLHDL 4 05/20/2010     Assessment & Plan  HTN (hypertension) Tolerating meds  and bp well controlled today  Sinusitis Resolved with recent treatment, no concerns today  Dermatitis Improved with Triamcinolone cream may cotninue same prn  HYPERLIPIDEMIA Tolerating Zocor, will check lipid panel today despite the fact that he has eaten lunch due to his difficulty getting in  CRF (chronic renal failure) Check renal panel today  Fatigue Likely due to poor sleep secondary to caring for wife with dementia. Feels he is doing OK at this time

## 2011-05-03 NOTE — Assessment & Plan Note (Signed)
Tolerating meds and bp well controlled today

## 2011-05-03 NOTE — Assessment & Plan Note (Signed)
Likely due to poor sleep secondary to caring for wife with dementia. Feels he is doing OK at this time

## 2011-05-03 NOTE — Assessment & Plan Note (Signed)
Check renal panel today. 

## 2011-05-03 NOTE — Assessment & Plan Note (Signed)
Resolved with recent treatment, no concerns today

## 2011-05-03 NOTE — Patient Instructions (Signed)

## 2011-05-03 NOTE — Assessment & Plan Note (Signed)
Improved with Triamcinolone cream may cotninue same prn

## 2011-05-09 ENCOUNTER — Telehealth: Payer: Self-pay

## 2011-05-09 MED ORDER — SIMVASTATIN 20 MG PO TABS: 20.0000 mg | ORAL_TABLET | Freq: Every day | ORAL | Status: DC

## 2011-05-09 NOTE — Telephone Encounter (Signed)
Pt states he no longer takes this

## 2011-05-09 NOTE — Telephone Encounter (Signed)
We got a request to fill patients Niaspan? I see that this was removed off of his list? Do you want me to add the Niacin back? Please advise?  Simvastatin refilled

## 2011-05-09 NOTE — Telephone Encounter (Signed)
You will have to check with him, I do not remember having a conversation with him one way or the other, if he is taking it he may continue to take it just clarify how much he is taking

## 2011-05-27 ENCOUNTER — Other Ambulatory Visit: Payer: Self-pay | Admitting: Family Medicine

## 2011-05-27 ENCOUNTER — Telehealth: Payer: Self-pay | Admitting: Family Medicine

## 2011-05-27 DIAGNOSIS — M545 Low back pain: Secondary | ICD-10-CM

## 2011-05-27 NOTE — Telephone Encounter (Signed)
Wants to go Monday morning and its between vertebrae 4 and 5

## 2011-05-27 NOTE — Telephone Encounter (Signed)
Please advise 

## 2011-05-27 NOTE — Telephone Encounter (Signed)
Will fax order

## 2011-05-27 NOTE — Telephone Encounter (Signed)
Disk between 4 and 5.

## 2011-05-27 NOTE — Telephone Encounter (Signed)
Sure he can have any xray I have never done one for him, I need to know which part of his back bothers him the most to place the correct order

## 2011-05-27 NOTE — Telephone Encounter (Signed)
Ordered xray please fax order to [facility]

## 2011-06-02 ENCOUNTER — Telehealth: Payer: Self-pay

## 2011-06-02 NOTE — Telephone Encounter (Signed)
I called to make sure patient got xray results. Patient stated he has already received them

## 2011-06-13 ENCOUNTER — Other Ambulatory Visit: Payer: Self-pay

## 2011-06-13 DIAGNOSIS — C61 Malignant neoplasm of prostate: Secondary | ICD-10-CM

## 2011-06-13 MED ORDER — TAMSULOSIN HCL 0.4 MG PO CAPS: 0.4000 mg | ORAL_CAPSULE | Freq: Every day | ORAL | Status: DC

## 2011-06-22 ENCOUNTER — Ambulatory Visit (INDEPENDENT_AMBULATORY_CARE_PROVIDER_SITE_OTHER): Payer: Medicare Other | Admitting: Family Medicine

## 2011-06-22 ENCOUNTER — Encounter: Payer: Self-pay | Admitting: Family Medicine

## 2011-06-22 VITALS — BP 144/78 | HR 83 | Temp 98.6°F | Ht 66.0 in | Wt 143.0 lb

## 2011-06-22 DIAGNOSIS — I10 Essential (primary) hypertension: Secondary | ICD-10-CM

## 2011-06-22 DIAGNOSIS — L309 Dermatitis, unspecified: Secondary | ICD-10-CM

## 2011-06-22 DIAGNOSIS — L259 Unspecified contact dermatitis, unspecified cause: Secondary | ICD-10-CM

## 2011-06-22 MED ORDER — TRIAMCINOLONE ACETONIDE 0.1 % EX LOTN: TOPICAL_LOTION | Freq: Every day | CUTANEOUS | Status: AC

## 2011-06-22 MED ORDER — LORATADINE 10 MG PO TABS: 10.0000 mg | ORAL_TABLET | Freq: Every day | ORAL | Status: DC | PRN

## 2011-06-22 NOTE — Assessment & Plan Note (Signed)
Adequately controlled, he reports his systolic was 134 at his orthopaedics appt this am, will continue to monitor

## 2011-06-22 NOTE — Assessment & Plan Note (Signed)
Flared for the last month. Mostly on his back and a few spots on her left leg. This skin is scaly, itchy. Will start Triamcinolone lotion daily and have him start Loratadine daily and he will call next week if not improving and we will consider an oral steroid

## 2011-06-22 NOTE — Patient Instructions (Signed)

## 2011-06-22 NOTE — Progress Notes (Signed)
Patient ID: Sergio Anderson, male   DOB: [birth year], 78 y.o.   MRN: [id number] Sergio Anderson [id number] May 25, 1926 06/22/2011      Progress Note-Follow Up  Subjective  Chief Complaint  Chief Complaint  Patient presents with  . back broken out    itchy X 1 month    HPI  This is an 78 year old Caucasian male who is in today complaining of a one-month history of itchy rash on his back. He's had trouble in the past and of note when he received a steroid injection in improved. It's been present about a month. He denies any change or products. It is itchy and scaly. Has not used any topical treatments thus far. No recent illness, fevers, chills, chest pain, palpitations, shortness of breath, GI or GU complaints. Did see orthopedics this morning and had a steroid injection in his lower back  Past Medical History  Diagnosis Date  . SKIN CANCER, HX OF 03/01/2010  . PUD, HX OF 03/01/2010  . PROSTATITIS, ACUTE 04/01/2010  . PROSTATE CANCER 03/01/2010  . Nocturia 03/01/2010  . HYPERLIPIDEMIA 03/01/2010  . ELEVATED BP READING WITHOUT DX HYPERTENSION 03/01/2010  . COLONIC POLYPS, HX OF 03/01/2010  . CATARACT EXTRACTIONS, BILATERAL, HX OF 03/01/2010  . BACK PAIN, LUMBAR, WITH RADICULOPATHY 04/01/2010  . Pulmonary nodule 09/14/2010  . HTN (hypertension) 10/05/2010  . Sinusitis 03/30/2011  . Dermatitis 03/30/2011  . Fatigue 05/03/2011    Past Surgical History  Procedure Date  . Abdominal surgery 1960    Correction of PUD and hiatal hernia  . Tonsillectomy and adenoidectomy   . Colonoscopy w/ biopsies and polypectomy 1980's    several, benign  . Mose surgery to scalp     Family History  Problem Relation Age of Onset  . Stroke Mother   . Hip fracture Mother   . Lung disease Father     smoker  . Cancer Sister     lung/ smoker  . Heart disease Brother   . Heart attack Brother   . Alcohol abuse Maternal Grandfather   . Cancer Paternal Grandmother     colon  . Pneumonia Brother   . Alcohol abuse Brother    . Achalasia Son     History   Social History  . Marital Status: Married    Spouse Name: N/A    Number of Children: N/A  . Years of Education: N/A   Occupational History  . Not on file.   Social History Main Topics  . Smoking status: Never Smoker   . Smokeless tobacco: Never Used  . Alcohol Use: No  . Drug Use: Not on file  . Sexually Active: Not on file   Other Topics Concern  . Not on file   Social History Narrative  . No narrative on file    Current Outpatient Prescriptions on File Prior to Visit  Medication Sig Dispense Refill  . amLODipine (NORVASC) 2.5 MG tablet Take 1 tablet (2.5 mg total) by mouth daily.  90 tablet  1  . fluorouracil (EFUDEX) 5 % cream Apply topically 2 (two) times daily.      . MEGESTROL ACETATE PO Take by mouth.      . niacin (NIASPAN) 500 MG CR tablet Take 1 tablet (500 mg total) by mouth at bedtime. Take an aspirin 1 hour prior  30 tablet  3  . simvastatin (ZOCOR) 20 MG tablet Take 1 tablet (20 mg total) by mouth at bedtime.  30 tablet  8  . Tamsulosin  HCl (FLOMAX) 0.4 MG CAPS Take 1 capsule (0.4 mg total) by mouth daily.  30 capsule  3  . triamcinolone cream (KENALOG) 0.1 % Apply topically daily as needed (rash, pruritis).  454 g  1    Allergies  Allergen Reactions  . Rofecoxib     Review of Systems  Review of Systems  Constitutional: Negative for fever and malaise/fatigue.  HENT: Negative for congestion.   Eyes: Negative for discharge.  Respiratory: Negative for shortness of breath.   Cardiovascular: Negative for chest pain, palpitations and leg swelling.  Gastrointestinal: Negative for nausea, abdominal pain and diarrhea.  Genitourinary: Negative for dysuria.  Musculoskeletal: Negative for falls.  Skin: Positive for itching and rash.  Neurological: Negative for loss of consciousness and headaches.  Endo/Heme/Allergies: Negative for polydipsia.  Psychiatric/Behavioral: Negative for depression and suicidal ideas. The patient is  not nervous/anxious and does not have insomnia.     Objective  BP 144/78  Pulse 83  Temp(Src) 98.6 F (37 C) (Temporal)  Ht 5\' 6"  (1.676 m)  Wt 143 lb (64.864 kg)  BMI 23.08 kg/m2  SpO2 100%  Physical Exam  Physical Exam  Constitutional: He is oriented to person, place, and time and well-developed, well-nourished, and in no distress. No distress.  HENT:  Head: Normocephalic and atraumatic.  Eyes: Conjunctivae are normal.  Neck: Neck supple. No thyromegaly present.  Cardiovascular: Normal rate, regular rhythm and normal heart sounds.   No murmur heard. Pulmonary/Chest: Effort normal and breath sounds normal. No respiratory distress.  Abdominal: He exhibits no distension and no mass. There is no tenderness.  Musculoskeletal: He exhibits no edema.  Neurological: He is alert and oriented to person, place, and time.  Skin: Skin is warm and dry. Rash noted. There is erythema.       Dry, scaly skin on back with excoriated lesions scattered across back and on left leg.   Psychiatric: Memory, affect and judgment normal.    Lab Results  Component Value Date   TSH 1.43 05/03/2011   Lab Results  Component Value Date   WBC 9.0 05/03/2011   HGB 11.2* 05/03/2011   HCT 33.7* 05/03/2011   MCV 94.1 05/03/2011   PLT 298.0 05/03/2011   Lab Results  Component Value Date   CREATININE 1.7* 05/03/2011   BUN 25* 05/03/2011   NA 141 05/03/2011   K 4.8 05/03/2011   CL 111 05/03/2011   CO2 21 05/03/2011   Lab Results  Component Value Date   ALT 15 05/03/2011   AST 16 05/03/2011   ALKPHOS 67 05/03/2011   BILITOT 0.2* 05/03/2011   Lab Results  Component Value Date   CHOL 131 05/03/2011   Lab Results  Component Value Date   HDL 32.30* 05/03/2011   Lab Results  Component Value Date   LDLCALC 74 05/03/2011   Lab Results  Component Value Date   TRIG 125.0 05/03/2011   Lab Results  Component Value Date   CHOLHDL 4 05/03/2011     Assessment & Plan  Dermatitis Flared for the last month. Mostly on his back and  a few spots on her left leg. This skin is scaly, itchy. Will start Triamcinolone lotion daily and have him start Loratadine daily and he will call next week if not improving and we will consider an oral steroid  HTN (hypertension) Adequately controlled, he reports his systolic was 134 at his orthopaedics appt this am, will continue to monitor

## 2011-09-02 ENCOUNTER — Ambulatory Visit (INDEPENDENT_AMBULATORY_CARE_PROVIDER_SITE_OTHER): Payer: Medicare Other | Admitting: Family Medicine

## 2011-09-02 ENCOUNTER — Encounter: Payer: Self-pay | Admitting: Family Medicine

## 2011-09-02 VITALS — BP 126/72 | HR 98 | Temp 97.6°F | Ht 66.0 in | Wt 145.8 lb

## 2011-09-02 DIAGNOSIS — I10 Essential (primary) hypertension: Secondary | ICD-10-CM

## 2011-09-02 DIAGNOSIS — C61 Malignant neoplasm of prostate: Secondary | ICD-10-CM

## 2011-09-02 DIAGNOSIS — L309 Dermatitis, unspecified: Secondary | ICD-10-CM

## 2011-09-02 DIAGNOSIS — I77810 Thoracic aortic ectasia: Secondary | ICD-10-CM

## 2011-09-02 DIAGNOSIS — E785 Hyperlipidemia, unspecified: Secondary | ICD-10-CM

## 2011-09-02 DIAGNOSIS — L259 Unspecified contact dermatitis, unspecified cause: Secondary | ICD-10-CM

## 2011-09-02 DIAGNOSIS — R0602 Shortness of breath: Secondary | ICD-10-CM

## 2011-09-02 HISTORY — DX: Thoracic aortic ectasia: I77.810

## 2011-09-02 MED ORDER — SIMVASTATIN 20 MG PO TABS: 20.0000 mg | ORAL_TABLET | Freq: Every day | ORAL | Status: DC

## 2011-09-02 MED ORDER — AMLODIPINE BESYLATE 2.5 MG PO TABS: 2.5000 mg | ORAL_TABLET | Freq: Every day | ORAL | Status: DC

## 2011-09-02 MED ORDER — ASPIRIN 325 MG PO TABS: 325.0000 mg | ORAL_TABLET | Freq: Every day | ORAL | Status: DC

## 2011-09-02 MED ORDER — MEGESTROL ACETATE 40 MG PO TABS: 40.0000 mg | ORAL_TABLET | Freq: Two times a day (BID) | ORAL | Status: DC

## 2011-09-02 MED ORDER — TAMSULOSIN HCL 0.4 MG PO CAPS: 0.4000 mg | ORAL_CAPSULE | Freq: Every day | ORAL | Status: DC

## 2011-09-02 NOTE — Patient Instructions (Addendum)

## 2011-09-04 ENCOUNTER — Encounter: Payer: Self-pay | Admitting: Family Medicine

## 2011-09-04 DIAGNOSIS — R0602 Shortness of breath: Secondary | ICD-10-CM

## 2011-09-04 HISTORY — DX: Shortness of breath: R06.02

## 2011-09-04 NOTE — Progress Notes (Signed)
Patient ID: Sergio Anderson, male   DOB: 03-22-1926, 78 y.o.   MRN: [id number] Sergio Anderson [id number] 06-01-1926 09/04/2011      Progress Note-Follow Up  Subjective  Chief Complaint  Chief Complaint  Patient presents with  . Follow-up    4 month    HPI  Patient is an 78 year old Caucasian male who is in today for followup. Overall he is doing well but he is acknowledging of worsening difficulty or shortness of breath. He says it's gradually been worsening over the last year and his quadriparesis trouble walking to his mailbox. He denies any chest pain, palpitations, cough, sputum production GI or GU complaints associated with this. He is still swelling in his ankles and trouble sleeping. He is seeing urology and dermatology frequently and reports he is doing well and this regard. Reported a stress test about 2 years ago but has not had any cardiac workup since then.  Past Medical History  Diagnosis Date  . SKIN CANCER, HX OF 03/01/2010  . PUD, HX OF 03/01/2010  . PROSTATITIS, ACUTE 04/01/2010  . PROSTATE CANCER 03/01/2010  . Nocturia 03/01/2010  . HYPERLIPIDEMIA 03/01/2010  . ELEVATED BP READING WITHOUT DX HYPERTENSION 03/01/2010  . COLONIC POLYPS, HX OF 03/01/2010  . CATARACT EXTRACTIONS, BILATERAL, HX OF 03/01/2010  . BACK PAIN, LUMBAR, WITH RADICULOPATHY 04/01/2010  . Pulmonary nodule 09/14/2010  . HTN (hypertension) 10/05/2010  . Sinusitis 03/30/2011  . Dermatitis 03/30/2011  . Fatigue 05/03/2011  . Ascending aorta dilatation 09/02/2011  . SOB (shortness of breath) 09/04/2011    Past Surgical History  Procedure Date  . Abdominal surgery 1960    Correction of PUD and hiatal hernia  . Tonsillectomy and adenoidectomy   . Colonoscopy w/ biopsies and polypectomy 1980's    several, benign  . Mose surgery to scalp     Family History  Problem Relation Age of Onset  . Stroke Mother   . Hip fracture Mother   . Lung disease Father     smoker  . Cancer Sister     lung/ smoker  . Heart disease  Brother   . Heart attack Brother   . Alcohol abuse Maternal Grandfather   . Cancer Paternal Grandmother     colon  . Pneumonia Brother   . Alcohol abuse Brother   . Achalasia Son     History   Social History  . Marital Status: Married    Spouse Name: N/A    Number of Children: N/A  . Years of Education: N/A   Occupational History  . Not on file.   Social History Main Topics  . Smoking status: Never Smoker   . Smokeless tobacco: Never Used  . Alcohol Use: No  . Drug Use: Not on file  . Sexually Active: Not on file   Other Topics Concern  . Not on file   Social History Narrative  . No narrative on file    Current Outpatient Prescriptions on File Prior to Visit  Medication Sig Dispense Refill  . amLODipine (NORVASC) 2.5 MG tablet Take 1 tablet (2.5 mg total) by mouth daily.  90 tablet  3  . loratadine (CLARITIN) 10 MG tablet Take 1 tablet (10 mg total) by mouth daily as needed for allergies (dermatitis).  30 tablet  11  . simvastatin (ZOCOR) 20 MG tablet Take 1 tablet (20 mg total) by mouth at bedtime.  90 tablet  3  . triamcinolone lotion (KENALOG) 0.1 % Apply topically daily after breakfast. Apply  daily for the next week and then as needed  60 mL  1    Allergies  Allergen Reactions  . Rofecoxib     Review of Systems  Review of Systems  Constitutional: Negative for fever and malaise/fatigue.  HENT: Negative for congestion.   Eyes: Negative for discharge.  Respiratory: Positive for shortness of breath.   Cardiovascular: Negative for chest pain, palpitations and leg swelling.  Gastrointestinal: Negative for nausea, abdominal pain and diarrhea.  Genitourinary: Negative for dysuria.  Musculoskeletal: Negative for falls.  Skin: Negative for rash.  Neurological: Negative for loss of consciousness and headaches.  Endo/Heme/Allergies: Negative for polydipsia.  Psychiatric/Behavioral: Negative for depression and suicidal ideas. The patient is not nervous/anxious and  does not have insomnia.     Objective  BP 126/72  Pulse 98  Temp 97.6 F (36.4 C) (Temporal)  Ht 5\' 6"  (1.676 m)  Wt 145 lb 12.8 oz (66.134 kg)  BMI 23.53 kg/m2  SpO2 95%  Physical Exam  Physical Exam  Constitutional: He is oriented to person, place, and time and well-developed, well-nourished, and in no distress. No distress.  HENT:  Head: Normocephalic and atraumatic.  Eyes: Conjunctivae are normal.  Neck: Neck supple. No thyromegaly present.  Cardiovascular: Normal rate, regular rhythm and normal heart sounds.   No murmur heard. Pulmonary/Chest: Effort normal and breath sounds normal. No respiratory distress.  Abdominal: He exhibits no distension and no mass. There is no tenderness.  Musculoskeletal: He exhibits no edema.  Neurological: He is alert and oriented to person, place, and time.  Skin: Skin is warm.  Psychiatric: Memory, affect and judgment normal.    Lab Results  Component Value Date   TSH 1.43 05/03/2011   Lab Results  Component Value Date   WBC 9.0 05/03/2011   HGB 11.2* 05/03/2011   HCT 33.7* 05/03/2011   MCV 94.1 05/03/2011   PLT 298.0 05/03/2011   Lab Results  Component Value Date   CREATININE 1.7* 05/03/2011   BUN 25* 05/03/2011   NA 141 05/03/2011   K 4.8 05/03/2011   CL 111 05/03/2011   CO2 21 05/03/2011   Lab Results  Component Value Date   ALT 15 05/03/2011   AST 16 05/03/2011   ALKPHOS 67 05/03/2011   BILITOT 0.2* 05/03/2011   Lab Results  Component Value Date   CHOL 131 05/03/2011   Lab Results  Component Value Date   HDL 32.30* 05/03/2011   Lab Results  Component Value Date   LDLCALC 74 05/03/2011   Lab Results  Component Value Date   TRIG 125.0 05/03/2011   Lab Results  Component Value Date   CHOLHDL 4 05/03/2011     Assessment & Plan  HTN (hypertension) Adequately controlled, no changes  HYPERLIPIDEMIA Adequately controlled, no change in meds will continue to monitor  SOB (shortness of breath) Patient notably more sob over past year.  Gradual progression he is reporting, now having trouble just walking to his mailbox, he has a cardiologist we will refer him back for further evaluation, he will seek immediate medical care if symptoms suddenly worsen.

## 2011-09-04 NOTE — Assessment & Plan Note (Signed)
Adequately controlled, no change in meds will continue to monitor

## 2011-09-04 NOTE — Assessment & Plan Note (Signed)
Adequately controlled, no changes 

## 2011-09-04 NOTE — Assessment & Plan Note (Signed)
Patient notably more sob over past year. Gradual progression he is reporting, now having trouble just walking to his mailbox, he has a cardiologist we will refer him back for further evaluation, he will seek immediate medical care if symptoms suddenly worsen.

## 2011-10-24 ENCOUNTER — Encounter: Payer: Self-pay | Admitting: Family Medicine

## 2011-10-24 ENCOUNTER — Ambulatory Visit (INDEPENDENT_AMBULATORY_CARE_PROVIDER_SITE_OTHER): Payer: Medicare Other | Admitting: Family Medicine

## 2011-10-24 VITALS — BP 124/82 | HR 87 | Temp 97.9°F | Ht 66.0 in | Wt 145.8 lb

## 2011-10-24 DIAGNOSIS — R0602 Shortness of breath: Secondary | ICD-10-CM

## 2011-10-24 DIAGNOSIS — I10 Essential (primary) hypertension: Secondary | ICD-10-CM

## 2011-10-24 DIAGNOSIS — Z23 Encounter for immunization: Secondary | ICD-10-CM

## 2011-10-24 DIAGNOSIS — L259 Unspecified contact dermatitis, unspecified cause: Secondary | ICD-10-CM

## 2011-10-24 DIAGNOSIS — L309 Dermatitis, unspecified: Secondary | ICD-10-CM

## 2011-10-24 DIAGNOSIS — R0609 Other forms of dyspnea: Secondary | ICD-10-CM

## 2011-10-24 MED ORDER — ZOSTER VACCINE LIVE 19400 UNT/0.65ML ~~LOC~~ SOLR: 0.6500 mL | Freq: Once | SUBCUTANEOUS | Status: DC

## 2011-10-24 MED ORDER — CLOBETASOL PROPIONATE 0.05 % EX LOTN: TOPICAL_LOTION | CUTANEOUS | Status: DC

## 2011-10-24 NOTE — Assessment & Plan Note (Signed)
Adequately controlled no change in meds today

## 2011-10-24 NOTE — Assessment & Plan Note (Signed)
Slowly worsening over past year. Recent cardiac work up unremarkable, will refer to pulmonology for further work up at this time.

## 2011-10-24 NOTE — Progress Notes (Signed)
Patient ID: Sergio Anderson, male   DOB: 1926-08-21, 78 y.o.   MRN: [id number] Sergio Anderson [id number] 03-Apr-1926 10/24/2011      Progress Note-Follow Up  Subjective  Chief Complaint  Chief Complaint  Patient presents with  . Follow-up    back is still breaking out  . Injections    flu    HPI    Patient is 78 year old Caucasian male who is in today for followup. He reports overall he is doing well. He does continue to have some intermittent trouble dermatitis and this triamcinolone cream helps partially. He notes previously when he had a steroid injection for his back I was unclear as to skin is felt in quite some time. Right now his lesions are not too bad but he will frequently have outbreaks with itchy lesions on his back. Otherwise she denies any recent illness, fevers, chills, chest pain, palpitations. Does complain of shortness of breath with exertion which is slowly worsening over about the last year.  Past Medical History  Diagnosis Date  . SKIN CANCER, HX OF 03/01/2010  . PUD, HX OF 03/01/2010  . PROSTATITIS, ACUTE 04/01/2010  . PROSTATE CANCER 03/01/2010  . Nocturia 03/01/2010  . HYPERLIPIDEMIA 03/01/2010  . ELEVATED BP READING WITHOUT DX HYPERTENSION 03/01/2010  . COLONIC POLYPS, HX OF 03/01/2010  . CATARACT EXTRACTIONS, BILATERAL, HX OF 03/01/2010  . BACK PAIN, LUMBAR, WITH RADICULOPATHY 04/01/2010  . Pulmonary nodule 09/14/2010  . HTN (hypertension) 10/05/2010  . Sinusitis 03/30/2011  . Dermatitis 03/30/2011  . Fatigue 05/03/2011  . Ascending aorta dilatation 09/02/2011  . SOB (shortness of breath) 09/04/2011    Past Surgical History  Procedure Date  . Abdominal surgery 1960    Correction of PUD and hiatal hernia  . Tonsillectomy and adenoidectomy   . Colonoscopy w/ biopsies and polypectomy 1980's    several, benign  . Mose surgery to scalp     Family History  Problem Relation Age of Onset  . Stroke Mother   . Hip fracture Mother   . Lung disease Father     smoker  . Cancer  Sister     lung/ smoker  . Heart disease Brother   . Heart attack Brother   . Alcohol abuse Maternal Grandfather   . Cancer Paternal Grandmother     colon  . Pneumonia Brother   . Alcohol abuse Brother   . Achalasia Son     History   Social History  . Marital Status: Married    Spouse Name: N/A    Number of Children: N/A  . Years of Education: N/A   Occupational History  . Not on file.   Social History Main Topics  . Smoking status: Never Smoker   . Smokeless tobacco: Never Used  . Alcohol Use: No  . Drug Use: Not on file  . Sexually Active: Not on file   Other Topics Concern  . Not on file   Social History Narrative  . No narrative on file    Current Outpatient Prescriptions on File Prior to Visit  Medication Sig Dispense Refill  . amLODipine (NORVASC) 2.5 MG tablet Take 1 tablet (2.5 mg total) by mouth daily.  90 tablet  3  . aspirin 325 MG tablet Take 1 tablet (325 mg total) by mouth daily.  30 tablet  0  . loratadine (CLARITIN) 10 MG tablet Take 1 tablet (10 mg total) by mouth daily as needed for allergies (dermatitis).  30 tablet  11  . megestrol (  MEGACE) 40 MG tablet Take 1 tablet (40 mg total) by mouth 2 (two) times daily.  180 tablet  3  . simvastatin (ZOCOR) 20 MG tablet Take 1 tablet (20 mg total) by mouth at bedtime.  90 tablet  3  . Tamsulosin HCl (FLOMAX) 0.4 MG CAPS Take 1 capsule (0.4 mg total) by mouth daily.  90 capsule  3  . triamcinolone lotion (KENALOG) 0.1 % Apply topically daily after breakfast. Apply daily for the next week and then as needed  60 mL  1    Allergies  Allergen Reactions  . Rofecoxib     Review of Systems  Review of Systems  Constitutional: Negative for fever and malaise/fatigue.  HENT: Negative for congestion.   Eyes: Negative for discharge.  Respiratory: Positive for shortness of breath.   Cardiovascular: Negative for chest pain, palpitations and leg swelling.  Gastrointestinal: Negative for nausea, abdominal pain and  diarrhea.  Genitourinary: Negative for dysuria.  Musculoskeletal: Negative for falls.  Skin: Negative for rash.  Neurological: Negative for loss of consciousness and headaches.  Endo/Heme/Allergies: Negative for polydipsia.  Psychiatric/Behavioral: Negative for depression and suicidal ideas. The patient is not nervous/anxious and does not have insomnia.     Objective  BP 124/82  Pulse 87  Temp 97.9 F (36.6 C) (Temporal)  Ht 5\' 6"  (1.676 m)  Wt 145 lb 12.8 oz (66.134 kg)  BMI 23.53 kg/m2  SpO2 99%  Physical Exam  Physical Exam  Constitutional: He is oriented to person, place, and time and well-developed, well-nourished, and in no distress. No distress.  HENT:  Head: Normocephalic and atraumatic.  Eyes: Conjunctivae normal are normal.  Neck: Neck supple. No thyromegaly present.  Cardiovascular: Normal rate and regular rhythm.   Murmur heard.      2/6 sys murmur  Pulmonary/Chest: Effort normal and breath sounds normal. No respiratory distress.  Abdominal: He exhibits no distension and no mass. There is no tenderness.  Musculoskeletal: He exhibits no edema.  Neurological: He is alert and oriented to person, place, and time.  Skin: Skin is warm.  Psychiatric: Memory, affect and judgment normal.    Lab Results  Component Value Date   TSH 1.43 05/03/2011   Lab Results  Component Value Date   WBC 9.0 05/03/2011   HGB 11.2* 05/03/2011   HCT 33.7* 05/03/2011   MCV 94.1 05/03/2011   PLT 298.0 05/03/2011   Lab Results  Component Value Date   CREATININE 1.7* 05/03/2011   BUN 25* 05/03/2011   NA 141 05/03/2011   K 4.8 05/03/2011   CL 111 05/03/2011   CO2 21 05/03/2011   Lab Results  Component Value Date   ALT 15 05/03/2011   AST 16 05/03/2011   ALKPHOS 67 05/03/2011   BILITOT 0.2* 05/03/2011   Lab Results  Component Value Date   CHOL 131 05/03/2011   Lab Results  Component Value Date   HDL 32.30* 05/03/2011   Lab Results  Component Value Date   LDLCALC 74 05/03/2011   Lab Results    Component Value Date   TRIG 125.0 05/03/2011   Lab Results  Component Value Date   CHOLHDL 4 05/03/2011     Assessment & Plan  SOB (shortness of breath) Slowly worsening over past year. Recent cardiac work up unremarkable, will refer to pulmonology for further work up at this time.   HTN (hypertension) Adequately controlled no change in meds today  Dermatitis Triamcinolone not strong enough, will d/c and try Clobetasole lotion prn  Rx given to take to pharmacy for Zostavax

## 2011-10-24 NOTE — Assessment & Plan Note (Signed)
Triamcinolone not strong enough, will d/c and try Clobetasole lotion prn

## 2011-10-28 ENCOUNTER — Encounter: Payer: Self-pay | Admitting: Pulmonary Disease

## 2011-10-28 ENCOUNTER — Ambulatory Visit (INDEPENDENT_AMBULATORY_CARE_PROVIDER_SITE_OTHER): Payer: Medicare Other | Admitting: Pulmonary Disease

## 2011-10-28 VITALS — BP 122/76 | HR 76 | Temp 98.0°F | Ht 68.0 in | Wt 146.4 lb

## 2011-10-28 DIAGNOSIS — R0602 Shortness of breath: Secondary | ICD-10-CM

## 2011-10-28 NOTE — Progress Notes (Signed)
  Subjective:    Patient ID: Sergio Anderson, male    DOB: [birth year], 78 y.o.   MRN: [id number]  HPI 78 y.o never smoker with Adequately controlled hypertension for evaluation of dyspnea. This is worse on walking, he leads a sedentary lifestyle, worse over last 3 months but really ongoing for about a year. He underwent evaluation by Cards-  Allyson Sabal, stress/ echo per pt was negative He had prostate Ca - peterson, s/p cryotherapy, psa nml CT dec '12 showed large hiatal hernia, mild bronchiectasis & nodularity attributed to scarring. A 3cm left renal cyst was also noted. CXR 9/13 showed kyphosis & hiatal hernia Spirometry showed no evidence of airway obstruction & FEV1 of 94% He did not desaturate on exertion No obvious GERD or upper airway symptoms, post nasal drip  Past Medical History  Diagnosis Date  . SKIN CANCER, HX OF 03/01/2010  . PUD, HX OF 03/01/2010  . PROSTATITIS, ACUTE 04/01/2010  . PROSTATE CANCER 03/01/2010  . Nocturia 03/01/2010  . HYPERLIPIDEMIA 03/01/2010  . ELEVATED BP READING WITHOUT DX HYPERTENSION 03/01/2010  . COLONIC POLYPS, HX OF 03/01/2010  . CATARACT EXTRACTIONS, BILATERAL, HX OF 03/01/2010  . BACK PAIN, LUMBAR, WITH RADICULOPATHY 04/01/2010  . Pulmonary nodule 09/14/2010  . HTN (hypertension) 10/05/2010  . Sinusitis 03/30/2011  . Dermatitis 03/30/2011  . Fatigue 05/03/2011  . Ascending aorta dilatation 09/02/2011  . SOB (shortness of breath) 09/04/2011   Past Surgical History  Procedure Date  . Abdominal surgery 1960    Correction of PUD and hiatal hernia  . Tonsillectomy and adenoidectomy   . Colonoscopy w/ biopsies and polypectomy 1980's    several, benign  . Mose surgery to scalp      Review of Systems  Constitutional: Negative for fever, appetite change and unexpected weight change.  HENT: Negative for ear pain, congestion, sore throat, trouble swallowing and sinus pressure.   Respiratory: Positive for shortness of breath. Negative for cough.   Cardiovascular: Negative  for chest pain, palpitations and leg swelling.  Gastrointestinal: Negative for abdominal pain.  Musculoskeletal: Negative for joint swelling.  Skin: Negative for rash.  Neurological: Negative for headaches.  Psychiatric/Behavioral: Negative for dysphoric mood. The patient is not nervous/anxious.        Objective:   Physical Exam  Gen. Pleasant, well-nourished, in no distress, normal affect ENT - no lesions, no post nasal drip Neck: No JVD, no thyromegaly, no carotid bruits Lungs: no use of accessory muscles, no dullness to percussion, clear without rales or rhonchi  Cardiovascular: Rhythm regular, heart sounds  normal, no murmurs or gallops, no peripheral edema Abdomen: soft and non-tender, no hepatosplenomegaly, BS normal. Musculoskeletal: No deformities, no cyanosis or clubbing Neuro:  alert, non focal       Assessment & Plan:

## 2011-10-28 NOTE — Patient Instructions (Addendum)
Lung function appears Ok Increase your activity level gradually

## 2011-11-02 NOTE — Assessment & Plan Note (Signed)
No clear pulmonary cause identified He does not have airway obstruction or restriction on spirometry No obvious fibrosis on prior CT Dyspnea does not seem to  be a GERD or angina equivalent - note recent neg cardiac evaln Offered him a fu CT chest , he declined & I think that is OK Not sure if the left renal cyst noted on CT dec '12 needs FU -defer to primary

## 2011-12-21 ENCOUNTER — Encounter: Payer: Self-pay | Admitting: Family Medicine

## 2011-12-21 ENCOUNTER — Ambulatory Visit (INDEPENDENT_AMBULATORY_CARE_PROVIDER_SITE_OTHER): Payer: Medicare Other | Admitting: Family Medicine

## 2011-12-21 VITALS — BP 155/79 | HR 79 | Temp 97.9°F | Ht 66.0 in | Wt 146.4 lb

## 2011-12-21 DIAGNOSIS — D649 Anemia, unspecified: Secondary | ICD-10-CM

## 2011-12-21 DIAGNOSIS — L309 Dermatitis, unspecified: Secondary | ICD-10-CM

## 2011-12-21 DIAGNOSIS — N189 Chronic kidney disease, unspecified: Secondary | ICD-10-CM

## 2011-12-21 DIAGNOSIS — IMO0002 Reserved for concepts with insufficient information to code with codable children: Secondary | ICD-10-CM

## 2011-12-21 DIAGNOSIS — L259 Unspecified contact dermatitis, unspecified cause: Secondary | ICD-10-CM

## 2011-12-21 DIAGNOSIS — R259 Unspecified abnormal involuntary movements: Secondary | ICD-10-CM

## 2011-12-21 DIAGNOSIS — R251 Tremor, unspecified: Secondary | ICD-10-CM

## 2011-12-21 DIAGNOSIS — I10 Essential (primary) hypertension: Secondary | ICD-10-CM

## 2011-12-21 DIAGNOSIS — E785 Hyperlipidemia, unspecified: Secondary | ICD-10-CM

## 2011-12-21 LAB — RENAL FUNCTION PANEL
Albumin: 3.6 g/dL (ref 3.5–5.2)
BUN: 31 mg/dL — ABNORMAL HIGH (ref 6–23)
Calcium: 9.1 mg/dL (ref 8.4–10.5)
Chloride: 110 mEq/L (ref 96–112)
Potassium: 4.2 mEq/L (ref 3.5–5.1)

## 2011-12-21 LAB — HEPATIC FUNCTION PANEL
ALT: 8 U/L (ref 0–53)
AST: 14 U/L (ref 0–37)
Alkaline Phosphatase: 64 U/L (ref 39–117)
Bilirubin, Direct: 0.1 mg/dL (ref 0.0–0.3)
Total Protein: 7.1 g/dL (ref 6.0–8.3)

## 2011-12-21 LAB — CBC
Hemoglobin: 10.4 g/dL — ABNORMAL LOW (ref 13.0–17.0)
MCV: 83.8 fl (ref 78.0–100.0)
Platelets: 326 10*3/uL (ref 150.0–400.0)
RBC: 3.86 Mil/uL — ABNORMAL LOW (ref 4.22–5.81)

## 2011-12-21 LAB — LIPID PANEL
LDL Cholesterol: 58 mg/dL (ref 0–99)
Total CHOL/HDL Ratio: 5

## 2011-12-21 MED ORDER — CLOBETASOL PROPIONATE 0.05 % EX LOTN: TOPICAL_LOTION | CUTANEOUS | Status: DC

## 2011-12-21 MED ORDER — METOPROLOL SUCCINATE ER 25 MG PO TB24: 25.0000 mg | ORAL_TABLET | Freq: Every day | ORAL | Status: DC

## 2011-12-21 NOTE — Patient Instructions (Addendum)

## 2011-12-23 ENCOUNTER — Encounter: Payer: Self-pay | Admitting: Family Medicine

## 2011-12-23 ENCOUNTER — Ambulatory Visit: Payer: Medicare Other

## 2011-12-23 ENCOUNTER — Other Ambulatory Visit: Payer: Self-pay

## 2011-12-23 DIAGNOSIS — D649 Anemia, unspecified: Secondary | ICD-10-CM

## 2011-12-23 DIAGNOSIS — R251 Tremor, unspecified: Secondary | ICD-10-CM

## 2011-12-23 HISTORY — DX: Tremor, unspecified: R25.1

## 2011-12-23 HISTORY — DX: Anemia, unspecified: D64.9

## 2011-12-23 LAB — IBC PANEL
Iron: 40 ug/dL — ABNORMAL LOW (ref 42–165)
Saturation Ratios: 9.8 % — ABNORMAL LOW (ref 20.0–50.0)

## 2011-12-23 LAB — FERRITIN: Ferritin: 12.7 ng/mL — ABNORMAL LOW (ref 22.0–322.0)

## 2011-12-23 MED ORDER — FERROUS FUMARATE-FOLIC ACID 324-1 MG PO TABS: 1.0000 | ORAL_TABLET | Freq: Every day | ORAL | Status: DC

## 2011-12-23 NOTE — Assessment & Plan Note (Signed)
He notes a tremor for about 2 months now. Has the most trouble is that when he tried to eat. She declines referral to neurology at this time but will get some lab work and start him on beta blockade at this time reassess at next visit.

## 2011-12-23 NOTE — Assessment & Plan Note (Signed)
Pain tolerable at this time, no changes

## 2011-12-23 NOTE — Progress Notes (Signed)
Patient ID: Sergio Anderson, male   DOB: 1926-11-08, 78 y.o.   MRN: [id number] BENUEL LY [id number] [birth year] 12/23/2011      Progress Note-Follow Up  Subjective  Chief Complaint  Chief Complaint  Patient presents with  . Follow-up    2 month    HPI  Patient no Caucasian male who is in today for followup. Overall he is feeling well but he does note over the last 2 months roughly he's been noticing some tremors. He notes them generally in his head but also in his hands especially when he is trying to. Denies any change in medications falls or other acute illness. No headache, chest pain, palpitations, shortness of breath, fevers, GI or GU complaints at this time.  Past Medical History  Diagnosis Date  . SKIN CANCER, HX OF 03/01/2010  . PUD, HX OF 03/01/2010  . PROSTATITIS, ACUTE 04/01/2010  . PROSTATE CANCER 03/01/2010  . Nocturia 03/01/2010  . HYPERLIPIDEMIA 03/01/2010  . ELEVATED BP READING WITHOUT DX HYPERTENSION 03/01/2010  . COLONIC POLYPS, HX OF 03/01/2010  . CATARACT EXTRACTIONS, BILATERAL, HX OF 03/01/2010  . BACK PAIN, LUMBAR, WITH RADICULOPATHY 04/01/2010  . Pulmonary nodule 09/14/2010  . HTN (hypertension) 10/05/2010  . Sinusitis 03/30/2011  . Dermatitis 03/30/2011  . Fatigue 05/03/2011  . Ascending aorta dilatation 09/02/2011  . SOB (shortness of breath) 09/04/2011  . Tremor 12/23/2011    Past Surgical History  Procedure Date  . Abdominal surgery 1960    Correction of PUD and hiatal hernia  . Tonsillectomy and adenoidectomy   . Colonoscopy w/ biopsies and polypectomy 1980's    several, benign  . Mose surgery to scalp     Family History  Problem Relation Age of Onset  . Stroke Mother   . Hip fracture Mother   . Lung disease Father     smoker  . Cancer Sister     lung/ smoker  . Heart disease Brother   . Heart attack Brother   . Alcohol abuse Maternal Grandfather   . Cancer Paternal Grandmother     colon  . Pneumonia Brother   . Alcohol abuse Brother   . Achalasia  Son     History   Social History  . Marital Status: Married    Spouse Name: N/A    Number of Children: N/A  . Years of Education: N/A   Occupational History  . Not on file.   Social History Main Topics  . Smoking status: Never Smoker   . Smokeless tobacco: Never Used  . Alcohol Use: No  . Drug Use: Not on file  . Sexually Active: Not on file   Other Topics Concern  . Not on file   Social History Narrative  . No narrative on file    Current Outpatient Prescriptions on File Prior to Visit  Medication Sig Dispense Refill  . amLODipine (NORVASC) 2.5 MG tablet Take 1 tablet (2.5 mg total) by mouth daily.  90 tablet  3  . aspirin 325 MG tablet Take 1 tablet (325 mg total) by mouth daily.  30 tablet  0  . loratadine (CLARITIN) 10 MG tablet Take 1 tablet (10 mg total) by mouth daily as needed for allergies (dermatitis).  30 tablet  11  . megestrol (MEGACE) 40 MG tablet Take 1 tablet (40 mg total) by mouth 2 (two) times daily.  180 tablet  3  . simvastatin (ZOCOR) 20 MG tablet Take 1 tablet (20 mg total) by mouth at bedtime.  90 tablet  3  . Tamsulosin HCl (FLOMAX) 0.4 MG CAPS Take 1 capsule (0.4 mg total) by mouth daily.  90 capsule  3  . triamcinolone lotion (KENALOG) 0.1 % Apply topically daily after breakfast. Apply daily for the next week and then as needed  60 mL  1  . zoster vaccine live, PF, (ZOSTAVAX) 29562 UNT/0.65ML injection Inject 19,400 Units into the skin once.  1 each  0  . metoprolol succinate (TOPROL-XL) 25 MG 24 hr tablet Take 1 tablet (25 mg total) by mouth daily.  30 tablet  5    Allergies  Allergen Reactions  . Rofecoxib     Review of Systems  Review of Systems  Constitutional: Negative for fever, chills and malaise/fatigue.  HENT: Negative for hearing loss, nosebleeds and congestion.   Eyes: Negative for discharge.  Respiratory: Negative for cough, sputum production, shortness of breath and wheezing.   Cardiovascular: Negative for chest pain,  palpitations and leg swelling.  Gastrointestinal: Negative for heartburn, nausea, vomiting, abdominal pain, diarrhea, constipation and blood in stool.  Genitourinary: Negative for dysuria, urgency, frequency and hematuria.  Musculoskeletal: Negative for myalgias, back pain and falls.  Skin: Negative for rash.  Neurological: Positive for tremors. Negative for dizziness, sensory change, focal weakness, loss of consciousness, weakness and headaches.  Endo/Heme/Allergies: Negative for polydipsia. Does not bruise/bleed easily.  Psychiatric/Behavioral: Negative for depression and suicidal ideas. The patient is not nervous/anxious and does not have insomnia.     Objective  BP 155/79  Pulse 79  Temp 97.9 F (36.6 C) (Temporal)  Ht 5\' 6"  (1.676 m)  Wt 146 lb 6.4 oz (66.407 kg)  BMI 23.63 kg/m2  SpO2 98%  Physical Exam  Physical Exam  Constitutional: He is oriented to person, place, and time and well-developed, well-nourished, and in no distress. No distress.  HENT:  Head: Normocephalic and atraumatic.  Eyes: Conjunctivae normal are normal.  Neck: Neck supple. No thyromegaly present.  Cardiovascular: Normal rate, regular rhythm and normal heart sounds.   No murmur heard. Pulmonary/Chest: Effort normal and breath sounds normal. No respiratory distress.  Abdominal: He exhibits no distension and no mass. There is no tenderness.  Musculoskeletal: He exhibits no edema.  Neurological: He is alert and oriented to person, place, and time.       Tremor most notable in head, CN 2-10 intact  Skin: Skin is warm.  Psychiatric: Memory, affect and judgment normal.    Lab Results  Component Value Date   TSH 2.58 12/21/2011   Lab Results  Component Value Date   WBC 10.3 12/21/2011   HGB 10.4* 12/21/2011   HCT 32.3* 12/21/2011   MCV 83.8 12/21/2011   PLT 326.0 12/21/2011   Lab Results  Component Value Date   CREATININE 1.9* 12/21/2011   BUN 31* 12/21/2011   NA 137 12/21/2011   K 4.2  12/21/2011   CL 110 12/21/2011   CO2 20 12/21/2011   Lab Results  Component Value Date   ALT 8 12/21/2011   AST 14 12/21/2011   ALKPHOS 64 12/21/2011   BILITOT 0.4 12/21/2011   Lab Results  Component Value Date   CHOL 116 12/21/2011   Lab Results  Component Value Date   HDL 25.70* 12/21/2011   Lab Results  Component Value Date   LDLCALC 58 12/21/2011   Lab Results  Component Value Date   TRIG 161.0* 12/21/2011   Lab Results  Component Value Date   CHOLHDL 5 12/21/2011     Assessment &  Plan BACK PAIN, LUMBAR, WITH RADICULOPATHY Pain tolerable at this time, no changes  Tremor He notes a tremor for about 2 months now. Has the most trouble is that when he tried to eat. She declines referral to neurology at this time but will get some lab work and start him on beta blockade at this time reassess at next visit.  HYPERLIPIDEMIA Lipid panel today shows some mild elevation 5 not significant enough to change medications at this time. He is encouraged to return to minimize saturated fats and simple carbohydrates.  CRF (chronic renal failure) Slightly worse, will continue to monitor encouraged increased hydration  Anemia Worsening, check more extensive iron studies and start on Hemocyte F daily, repeat cbc at next visit

## 2011-12-23 NOTE — Progress Notes (Signed)
Quick Note:  Patient Informed and voiced understanding ______ 

## 2011-12-23 NOTE — Assessment & Plan Note (Signed)
Slightly worse, will continue to monitor encouraged increased hydration

## 2011-12-23 NOTE — Assessment & Plan Note (Signed)
Worsening, check more extensive iron studies and start on Hemocyte F daily, repeat cbc at next visit

## 2011-12-23 NOTE — Assessment & Plan Note (Signed)
Lipid panel today shows some mild elevation 5 not significant enough to change medications at this time. He is encouraged to return to minimize saturated fats and simple carbohydrates.

## 2012-01-03 ENCOUNTER — Ambulatory Visit: Payer: Medicare Other | Admitting: Family Medicine

## 2012-01-10 ENCOUNTER — Encounter: Payer: Self-pay | Admitting: Family Medicine

## 2012-02-01 ENCOUNTER — Encounter: Payer: Self-pay | Admitting: Family Medicine

## 2012-02-01 ENCOUNTER — Ambulatory Visit (INDEPENDENT_AMBULATORY_CARE_PROVIDER_SITE_OTHER): Payer: Medicare Other | Admitting: Family Medicine

## 2012-02-01 VITALS — BP 154/78 | HR 79 | Temp 98.7°F | Ht 66.0 in | Wt 144.1 lb

## 2012-02-01 DIAGNOSIS — N189 Chronic kidney disease, unspecified: Secondary | ICD-10-CM

## 2012-02-01 DIAGNOSIS — E785 Hyperlipidemia, unspecified: Secondary | ICD-10-CM

## 2012-02-01 DIAGNOSIS — D649 Anemia, unspecified: Secondary | ICD-10-CM

## 2012-02-01 DIAGNOSIS — I10 Essential (primary) hypertension: Secondary | ICD-10-CM

## 2012-02-01 LAB — RENAL FUNCTION PANEL
Albumin: 4.1 g/dL (ref 3.5–5.2)
Calcium: 8.9 mg/dL (ref 8.4–10.5)
Sodium: 140 mEq/L (ref 135–145)

## 2012-02-01 MED ORDER — METOPROLOL TARTRATE 25 MG PO TABS: 25.0000 mg | ORAL_TABLET | Freq: Two times a day (BID) | ORAL | Status: DC

## 2012-02-02 LAB — CBC WITH DIFFERENTIAL/PLATELET
HCT: 35.8 % — ABNORMAL LOW (ref 39.0–52.0)
Hemoglobin: 12.1 g/dL — ABNORMAL LOW (ref 13.0–17.0)
Lymphocytes Relative: 25 % (ref 12–46)
Lymphs Abs: 2.7 10*3/uL (ref 0.7–4.0)
Monocytes Absolute: 1.3 10*3/uL — ABNORMAL HIGH (ref 0.1–1.0)
Monocytes Relative: 12 % (ref 3–12)
Neutro Abs: 6.5 10*3/uL (ref 1.7–7.7)
Neutrophils Relative %: 60 % (ref 43–77)
RBC: 4.23 MIL/uL (ref 4.22–5.81)

## 2012-02-02 NOTE — Progress Notes (Signed)
Quick Note:  Patient Informed and voiced understanding ______ 

## 2012-02-02 NOTE — Assessment & Plan Note (Signed)
Improving continue iron supplement for now

## 2012-02-02 NOTE — Assessment & Plan Note (Signed)
Tolerating Zocor, no changes

## 2012-02-02 NOTE — Progress Notes (Signed)
Patient ID: Sergio Anderson, male   DOB: 1926/06/18, 79 y.o.   MRN: [id number] JAMEL HOLZMANN [id number] 1926-10-16 02/02/2012      Progress Note-Follow Up  Subjective  Chief Complaint  Chief Complaint  Patient presents with  . Follow-up    6 week follow up and labs (CBC and Renal)    HPI  Patient is an 79 year old Caucasian male who is in today for followup. Overall he's been doing well. No recent illness, fevers, chills, chest pain, palpitations, shortness of breath, GI or GU complaints. Continues to struggle with back pain. He stopped the metoprolol succinate secondary to cost her to tolerate the medication otherwise. Denies fatigue or constipation while taking the medication.  Past Medical History  Diagnosis Date  . SKIN CANCER, HX OF 03/01/2010  . PUD, HX OF 03/01/2010  . PROSTATITIS, ACUTE 04/01/2010  . PROSTATE CANCER 03/01/2010  . Nocturia 03/01/2010  . HYPERLIPIDEMIA 03/01/2010  . ELEVATED BP READING WITHOUT DX HYPERTENSION 03/01/2010  . COLONIC POLYPS, HX OF 03/01/2010  . CATARACT EXTRACTIONS, BILATERAL, HX OF 03/01/2010  . BACK PAIN, LUMBAR, WITH RADICULOPATHY 04/01/2010  . Pulmonary nodule 09/14/2010  . HTN (hypertension) 10/05/2010  . Sinusitis 03/30/2011  . Dermatitis 03/30/2011  . Fatigue 05/03/2011  . Ascending aorta dilatation 09/02/2011  . SOB (shortness of breath) 09/04/2011  . Tremor 12/23/2011  . Anemia 12/23/2011    Past Surgical History  Procedure Date  . Abdominal surgery 1960    Correction of PUD and hiatal hernia  . Tonsillectomy and adenoidectomy   . Colonoscopy w/ biopsies and polypectomy 1980's    several, benign  . Mose surgery to scalp     Family History  Problem Relation Age of Onset  . Stroke Mother   . Hip fracture Mother   . Lung disease Father     smoker  . Cancer Sister     lung/ smoker  . Heart disease Brother   . Heart attack Brother   . Alcohol abuse Maternal Grandfather   . Cancer Paternal Grandmother     colon  . Pneumonia Brother   .  Alcohol abuse Brother   . Achalasia Son     History   Social History  . Marital Status: Married    Spouse Name: N/A    Number of Children: N/A  . Years of Education: N/A   Occupational History  . Not on file.   Social History Main Topics  . Smoking status: Never Smoker   . Smokeless tobacco: Never Used  . Alcohol Use: No  . Drug Use: Not on file  . Sexually Active: Not on file   Other Topics Concern  . Not on file   Social History Narrative  . No narrative on file    Current Outpatient Prescriptions on File Prior to Visit  Medication Sig Dispense Refill  . amLODipine (NORVASC) 2.5 MG tablet Take 1 tablet (2.5 mg total) by mouth daily.  90 tablet  3  . aspirin 325 MG tablet Take 1 tablet (325 mg total) by mouth daily.  30 tablet  0  . Clobetasol Propionate 0.05 % lotion Apply sparingly as needed up to twice daily  118 mL  1  . Ferrous Fumarate-Folic Acid (HEMOCYTE-F) 324-1 MG TABS Take 1 tablet by mouth daily.  30 each  1  . loratadine (CLARITIN) 10 MG tablet Take 1 tablet (10 mg total) by mouth daily as needed for allergies (dermatitis).  30 tablet  11  . simvastatin (ZOCOR) 20  MG tablet Take 1 tablet (20 mg total) by mouth at bedtime.  90 tablet  3  . Tamsulosin HCl (FLOMAX) 0.4 MG CAPS Take 1 capsule (0.4 mg total) by mouth daily.  90 capsule  3  . triamcinolone lotion (KENALOG) 0.1 % Apply topically daily after breakfast. Apply daily for the next week and then as needed  60 mL  1  . metoprolol tartrate (LOPRESSOR) 25 MG tablet Take 1 tablet (25 mg total) by mouth 2 (two) times daily.  60 tablet  3    Allergies  Allergen Reactions  . Rofecoxib     Review of Systems  Review of Systems  Constitutional: Negative for fever and malaise/fatigue.  HENT: Negative for congestion.   Eyes: Negative for discharge.  Respiratory: Negative for shortness of breath.   Cardiovascular: Negative for chest pain, palpitations and leg swelling.  Gastrointestinal: Negative for  nausea, abdominal pain and diarrhea.  Genitourinary: Negative for dysuria.  Musculoskeletal: Positive for back pain. Negative for falls.  Skin: Negative for rash.  Neurological: Positive for tremors. Negative for loss of consciousness and headaches.  Endo/Heme/Allergies: Negative for polydipsia.  Psychiatric/Behavioral: Negative for depression and suicidal ideas. The patient is not nervous/anxious and does not have insomnia.     Objective  BP 154/78  Pulse 79  Temp 98.7 F (37.1 C) (Temporal)  Ht 5\' 6"  (1.676 m)  Wt 144 lb 1.9 oz (65.372 kg)  BMI 23.26 kg/m2  SpO2 99%  Physical Exam  Physical Exam  Constitutional: He is oriented to person, place, and time and well-developed, well-nourished, and in no distress. No distress.  HENT:  Head: Normocephalic and atraumatic.  Eyes: Conjunctivae normal are normal.  Neck: Neck supple. No thyromegaly present.  Cardiovascular: Normal rate, regular rhythm and normal heart sounds.   Pulmonary/Chest: Effort normal and breath sounds normal. No respiratory distress.  Abdominal: He exhibits no distension and no mass. There is no tenderness.  Musculoskeletal: He exhibits no edema.  Neurological: He is alert and oriented to person, place, and time.  Skin: Skin is warm.  Psychiatric: Memory, affect and judgment normal.    Lab Results  Component Value Date   TSH 2.58 12/21/2011   Lab Results  Component Value Date   WBC 10.8* 02/01/2012   HGB 12.1* 02/01/2012   HCT 35.8* 02/01/2012   MCV 84.6 02/01/2012   PLT 327 02/01/2012   Lab Results  Component Value Date   CREATININE 1.61* 02/01/2012   BUN 30* 02/01/2012   NA 140 02/01/2012   K 4.3 02/01/2012   CL 111 02/01/2012   CO2 18* 02/01/2012   Lab Results  Component Value Date   ALT 8 12/21/2011   AST 14 12/21/2011   ALKPHOS 64 12/21/2011   BILITOT 0.4 12/21/2011   Lab Results  Component Value Date   CHOL 116 12/21/2011   Lab Results  Component Value Date   HDL 25.70* 12/21/2011   Lab Results    Component Value Date   LDLCALC 58 12/21/2011   Lab Results  Component Value Date   TRIG 161.0* 12/21/2011   Lab Results  Component Value Date   CHOLHDL 5 12/21/2011     Assessment & Plan  CRF (chronic renal failure) Creatinine slightly improved   HTN (hypertension) Well controlled today no changes  Anemia Improving continue iron supplement for now  HYPERLIPIDEMIA Tolerating Zocor, no changes

## 2012-02-02 NOTE — Assessment & Plan Note (Signed)
Creatinine slightly improved

## 2012-02-02 NOTE — Assessment & Plan Note (Signed)
Well controlled today no changes 

## 2012-03-09 ENCOUNTER — Telehealth: Payer: Self-pay | Admitting: Family Medicine

## 2012-03-09 NOTE — Telephone Encounter (Signed)
Patient Information:  Caller Name: Rudie  Phone: [phone]  Patient: Geovanny, Sartin  Gender: Male  DOB: 01/14/1927  Age: 79 Years  PCP: Danise [facility])  Office Follow Up:  Does the office need to follow up with this patient?: Yes  Instructions For The Office: Please advise patient when he may get letter to excuse him from jury duty.  He wants to pick it up 2/14 or 2/17 if possible.   Thank you.   Symptoms  Reason For Call & Symptoms: Patient requests paper stating that he is unable to do jury duty.  He states the letter must be returned within 10 days and he needs this by Tuesday 2/ 18/14. He would like to pick it up ASAP.  Reviewed Health History In EMR: N/A  Reviewed Medications In EMR: N/A  Reviewed Allergies In EMR: N/A  Reviewed Surgeries / Procedures: N/A  Date of Onset of Symptoms: Unknown  Guideline(s) Used:  No Protocol Available - Information Only  Disposition Per Guideline:   Discuss with PCP and Callback by Nurse Today  Reason For Disposition Reached:   Nursing judgment  Advice Given:  Call Back If:  You become worse.

## 2012-03-12 NOTE — Telephone Encounter (Signed)
Please advise 

## 2012-03-12 NOTE — Telephone Encounter (Signed)
OK to write [employer] duty note for debility, chronic pain, anxiety

## 2012-03-14 NOTE — Telephone Encounter (Signed)
Letter done and put in mail per pts request

## 2012-03-27 ENCOUNTER — Ambulatory Visit: Payer: Medicare Other | Admitting: Family Medicine

## 2012-04-09 ENCOUNTER — Ambulatory Visit: Payer: Medicare Other | Admitting: Family Medicine

## 2012-04-26 ENCOUNTER — Encounter: Payer: Self-pay | Admitting: Family Medicine

## 2012-04-26 ENCOUNTER — Ambulatory Visit (INDEPENDENT_AMBULATORY_CARE_PROVIDER_SITE_OTHER): Payer: Medicare Other | Admitting: Family Medicine

## 2012-04-26 VITALS — BP 120/78 | HR 74 | Temp 97.4°F | Ht 66.0 in | Wt 144.1 lb

## 2012-04-26 DIAGNOSIS — D509 Iron deficiency anemia, unspecified: Secondary | ICD-10-CM

## 2012-04-26 DIAGNOSIS — E785 Hyperlipidemia, unspecified: Secondary | ICD-10-CM

## 2012-04-26 DIAGNOSIS — L309 Dermatitis, unspecified: Secondary | ICD-10-CM

## 2012-04-26 DIAGNOSIS — N289 Disorder of kidney and ureter, unspecified: Secondary | ICD-10-CM

## 2012-04-26 DIAGNOSIS — C61 Malignant neoplasm of prostate: Secondary | ICD-10-CM

## 2012-04-26 DIAGNOSIS — L259 Unspecified contact dermatitis, unspecified cause: Secondary | ICD-10-CM

## 2012-04-26 DIAGNOSIS — I10 Essential (primary) hypertension: Secondary | ICD-10-CM

## 2012-04-26 LAB — CBC
HCT: 38.4 % — ABNORMAL LOW (ref 39.0–52.0)
Hemoglobin: 12.7 g/dL — ABNORMAL LOW (ref 13.0–17.0)
MCH: 29.6 pg (ref 26.0–34.0)
MCV: 89.5 fL (ref 78.0–100.0)
Platelets: 291 10*3/uL (ref 150–400)
RBC: 4.29 MIL/uL (ref 4.22–5.81)
WBC: 9.1 10*3/uL (ref 4.0–10.5)

## 2012-04-26 LAB — RENAL FUNCTION PANEL
BUN: 24 mg/dL — ABNORMAL HIGH (ref 6–23)
CO2: 23 mEq/L (ref 19–32)
Chloride: 112 mEq/L (ref 96–112)
Creat: 1.62 mg/dL — ABNORMAL HIGH (ref 0.50–1.35)
Glucose, Bld: 85 mg/dL (ref 70–99)

## 2012-04-26 MED ORDER — CLOBETASOL PROPIONATE 0.05 % EX LOTN: TOPICAL_LOTION | CUTANEOUS | Status: DC

## 2012-04-26 NOTE — Patient Instructions (Signed)

## 2012-04-27 NOTE — Progress Notes (Signed)
Quick Note:  Patient Informed and voiced understanding ______ 

## 2012-04-28 ENCOUNTER — Encounter: Payer: Self-pay | Admitting: Family Medicine

## 2012-04-28 NOTE — Assessment & Plan Note (Signed)
Well controlled, no changes to meds today 

## 2012-04-28 NOTE — Progress Notes (Signed)
Patient ID: Sergio Anderson, male   DOB: July 20, 1926, 79 y.o.   MRN: [id number] Sergio Anderson [id number] 03/03/1926 04/28/2012      Progress Note-Follow Up  Subjective  Chief Complaint  Chief Complaint  Patient presents with  . Follow-up    HPI  Patient is a an 79 year old Caucasian male in today for followup. He reports feeling well. Does not have any recent illness. Denies any headaches, congestion, chest pain, palpitations, shortness or breath, GI or GU concerns. Is eating well. Continues to struggle with dermatitis and is requesting a refill on steroid cream  Past Medical History  Diagnosis Date  . SKIN CANCER, HX OF 03/01/2010  . PUD, HX OF 03/01/2010  . PROSTATITIS, ACUTE 04/01/2010  . PROSTATE CANCER 03/01/2010  . Nocturia 03/01/2010  . HYPERLIPIDEMIA 03/01/2010  . ELEVATED BP READING WITHOUT DX HYPERTENSION 03/01/2010  . COLONIC POLYPS, HX OF 03/01/2010  . CATARACT EXTRACTIONS, BILATERAL, HX OF 03/01/2010  . BACK PAIN, LUMBAR, WITH RADICULOPATHY 04/01/2010  . Pulmonary nodule 09/14/2010  . HTN (hypertension) 10/05/2010  . Sinusitis 03/30/2011  . Dermatitis 03/30/2011  . Fatigue 05/03/2011  . Ascending aorta dilatation 09/02/2011  . SOB (shortness of breath) 09/04/2011  . Tremor 12/23/2011  . Anemia 12/23/2011    Past Surgical History  Procedure Laterality Date  . Abdominal surgery  1960    Correction of PUD and hiatal hernia  . Tonsillectomy and adenoidectomy    . Colonoscopy w/ biopsies and polypectomy  1980's    several, benign  . Mose surgery to scalp      Family History  Problem Relation Age of Onset  . Stroke Mother   . Hip fracture Mother   . Lung disease Father     smoker  . Cancer Sister     lung/ smoker  . Heart disease Brother   . Heart attack Brother   . Alcohol abuse Maternal Grandfather   . Cancer Paternal Grandmother     colon  . Pneumonia Brother   . Alcohol abuse Brother   . Achalasia Son     History   Social History  . Marital Status: Married     Spouse Name: N/A    Number of Children: N/A  . Years of Education: N/A   Occupational History  . Not on file.   Social History Main Topics  . Smoking status: Never Smoker   . Smokeless tobacco: Never Used  . Alcohol Use: No  . Drug Use: Not on file  . Sexually Active: Not on file   Other Topics Concern  . Not on file   Social History Narrative  . No narrative on file    Current Outpatient Prescriptions on File Prior to Visit  Medication Sig Dispense Refill  . amLODipine (NORVASC) 2.5 MG tablet Take 1 tablet (2.5 mg total) by mouth daily.  90 tablet  3  . aspirin 325 MG tablet Take 1 tablet (325 mg total) by mouth daily.  30 tablet  0  . Ferrous Fumarate-Folic Acid (HEMOCYTE-F) 324-1 MG TABS Take 1 tablet by mouth daily.  30 each  1  . metoprolol tartrate (LOPRESSOR) 25 MG tablet Take 1 tablet (25 mg total) by mouth 2 (two) times daily.  60 tablet  3  . simvastatin (ZOCOR) 20 MG tablet Take 1 tablet (20 mg total) by mouth at bedtime.  90 tablet  3  . Tamsulosin HCl (FLOMAX) 0.4 MG CAPS Take 1 capsule (0.4 mg total) by mouth daily.  90 capsule  3  . triamcinolone lotion (KENALOG) 0.1 % Apply topically daily after breakfast. Apply daily for the next week and then as needed  60 mL  1   No current facility-administered medications on file prior to visit.    Allergies  Allergen Reactions  . Rofecoxib     Review of Systems  Review of Systems  Constitutional: Negative for fever and malaise/fatigue.  HENT: Negative for congestion.   Eyes: Negative for discharge.  Respiratory: Negative for shortness of breath.   Cardiovascular: Negative for chest pain, palpitations and leg swelling.  Gastrointestinal: Negative for nausea, abdominal pain and diarrhea.  Genitourinary: Negative for dysuria.  Musculoskeletal: Negative for falls.  Skin: Positive for itching and rash.  Neurological: Negative for loss of consciousness and headaches.  Endo/Heme/Allergies: Negative for polydipsia.   Psychiatric/Behavioral: Negative for depression and suicidal ideas. The patient is not nervous/anxious and does not have insomnia.     Objective  BP 120/78  Pulse 74  Temp(Src) 97.4 F (36.3 C) (Oral)  Ht 5\' 6"  (1.676 m)  Wt 144 lb 1.3 oz (65.354 kg)  BMI 23.27 kg/m2  SpO2 95%  Physical Exam  Physical Exam  Constitutional: He is oriented to person, place, and time and well-developed, well-nourished, and in no distress. No distress.  HENT:  Head: Normocephalic and atraumatic.  Eyes: Conjunctivae are normal.  Neck: Neck supple. No thyromegaly present.  Cardiovascular: Normal rate, regular rhythm and normal heart sounds.   Pulmonary/Chest: Effort normal and breath sounds normal. No respiratory distress.  Abdominal: He exhibits no distension and no mass. There is no tenderness.  Musculoskeletal: He exhibits no edema.  Neurological: He is alert and oriented to person, place, and time.  Skin: Skin is warm.  Psychiatric: Memory, affect and judgment normal.    Lab Results  Component Value Date   TSH 2.58 12/21/2011   Lab Results  Component Value Date   WBC 9.1 04/26/2012   HGB 12.7* 04/26/2012   HCT 38.4* 04/26/2012   MCV 89.5 04/26/2012   PLT 291 04/26/2012   Lab Results  Component Value Date   CREATININE 1.62* 04/26/2012   BUN 24* 04/26/2012   NA 139 04/26/2012   K 4.7 04/26/2012   CL 112 04/26/2012   CO2 23 04/26/2012   Lab Results  Component Value Date   ALT 8 12/21/2011   AST 14 12/21/2011   ALKPHOS 64 12/21/2011   BILITOT 0.4 12/21/2011   Lab Results  Component Value Date   CHOL 116 12/21/2011   Lab Results  Component Value Date   HDL 25.70* 12/21/2011   Lab Results  Component Value Date   LDLCALC 58 12/21/2011   Lab Results  Component Value Date   TRIG 161.0* 12/21/2011   Lab Results  Component Value Date   CHOLHDL 5 12/21/2011     Assessment & Plan  HTN (hypertension) Well controlled, no changes to meds today  Dermatitis Given refill on steorid cream  to use sparingly.  PROSTATE CANCER Doing well and follows with urology  HYPERLIPIDEMIA Tolerating simvastatin, avoid trans fats.

## 2012-04-28 NOTE — Assessment & Plan Note (Signed)
Doing well and follows with urology

## 2012-04-28 NOTE — Assessment & Plan Note (Signed)
Given refill on steorid cream to use sparingly.

## 2012-04-28 NOTE — Assessment & Plan Note (Signed)
Tolerating simvastatin, avoid trans fats.

## 2012-05-16 ENCOUNTER — Other Ambulatory Visit: Payer: Self-pay

## 2012-05-16 MED ORDER — METOPROLOL TARTRATE 25 MG PO TABS: 25.0000 mg | ORAL_TABLET | Freq: Two times a day (BID) | ORAL | Status: DC

## 2012-07-30 ENCOUNTER — Ambulatory Visit (INDEPENDENT_AMBULATORY_CARE_PROVIDER_SITE_OTHER): Payer: Medicare Other | Admitting: Family Medicine

## 2012-07-30 ENCOUNTER — Encounter: Payer: Self-pay | Admitting: Family Medicine

## 2012-07-30 VITALS — BP 110/62 | HR 65 | Temp 97.9°F | Ht 66.0 in | Wt 142.1 lb

## 2012-07-30 DIAGNOSIS — I10 Essential (primary) hypertension: Secondary | ICD-10-CM

## 2012-07-30 DIAGNOSIS — E785 Hyperlipidemia, unspecified: Secondary | ICD-10-CM

## 2012-07-30 DIAGNOSIS — H60399 Other infective otitis externa, unspecified ear: Secondary | ICD-10-CM

## 2012-07-30 DIAGNOSIS — D649 Anemia, unspecified: Secondary | ICD-10-CM

## 2012-07-30 DIAGNOSIS — N189 Chronic kidney disease, unspecified: Secondary | ICD-10-CM

## 2012-07-30 DIAGNOSIS — H6093 Unspecified otitis externa, bilateral: Secondary | ICD-10-CM

## 2012-07-30 DIAGNOSIS — T148XXA Other injury of unspecified body region, initial encounter: Secondary | ICD-10-CM

## 2012-07-30 HISTORY — DX: Other injury of unspecified body region, initial encounter: T14.8XXA

## 2012-07-30 MED ORDER — NEOMYCIN-POLYMYXIN-HC 3.5-10000-1 OT SOLN: 3.0000 [drp] | Freq: Two times a day (BID) | OTIC | Status: DC

## 2012-07-30 NOTE — Assessment & Plan Note (Signed)
Check cbc ad pt/inr today

## 2012-07-30 NOTE — Assessment & Plan Note (Signed)
Repeat cbc today 

## 2012-07-30 NOTE — Patient Instructions (Addendum)
Otitis Externa Otitis externa is a bacterial or fungal infection of the outer ear canal. This is the area from the eardrum to the outside of the ear. Otitis externa is sometimes called "swimmer's ear." CAUSES  Possible causes of infection include:  Swimming in dirty water.  Moisture remaining in the ear after swimming or bathing.  Mild injury (trauma) to the ear.  Objects stuck in the ear (foreign body).  Cuts or scrapes (abrasions) on the outside of the ear. SYMPTOMS  The first symptom of infection is often itching in the ear canal. Later signs and symptoms may include swelling and redness of the ear canal, ear pain, and yellowish-white fluid (pus) coming from the ear. The ear pain may be worse when pulling on the earlobe. DIAGNOSIS  Your caregiver will perform a physical exam. A sample of fluid may be taken from the ear and examined for bacteria or fungi. TREATMENT  Antibiotic ear drops are often given for 10 to 14 days. Treatment may also include pain medicine or corticosteroids to reduce itching and swelling. PREVENTION   Keep your ear dry. Use the corner of a towel to absorb water out of the ear canal after swimming or bathing.  Avoid scratching or putting objects inside your ear. This can damage the ear canal or remove the protective wax that lines the canal. This makes it easier for bacteria and fungi to grow.  Avoid swimming in lakes, polluted water, or poorly chlorinated pools.  You may use ear drops made of rubbing alcohol and vinegar after swimming. Combine equal parts of white vinegar and alcohol in a bottle. Put 3 or 4 drops into each ear after swimming. HOME CARE INSTRUCTIONS   Apply antibiotic ear drops to the ear canal as prescribed by your caregiver.  Only take over-the-counter or prescription medicines for pain, discomfort, or fever as directed by your caregiver.  If you have diabetes, follow any additional treatment instructions from your caregiver.  Keep all  follow-up appointments as directed by your caregiver. SEEK MEDICAL CARE IF:   You have a fever.  Your ear is still red, swollen, painful, or draining pus after 3 days.  Your redness, swelling, or pain gets worse.  You have a severe headache.  You have redness, swelling, pain, or tenderness in the area behind your ear. MAKE SURE YOU:   Understand these instructions.  Will watch your condition.  Will get help right away if you are not doing well or get worse. Document Released: 01/10/2005 Document Revised: 04/04/2011 Document Reviewed: 01/27/2011 [employer] Patient Information 2014 [employer].  

## 2012-07-30 NOTE — Assessment & Plan Note (Signed)
Well controlled on current meds no changes 

## 2012-07-30 NOTE — Assessment & Plan Note (Signed)
Tolerating Zocor. Avoid trans fats

## 2012-07-30 NOTE — Progress Notes (Signed)
Patient ID: Sergio Anderson, male   DOB: [birth year], 79 y.o.   MRN: [id number] Sergio Anderson [id number] 11/08/1926 07/30/2012      Progress Note-Follow Up  Subjective  Chief Complaint  Chief Complaint  Patient presents with  . Follow-up    4 month    HPI  Patient is an 79 year old Caucasian male who is in today for followup. Overall is doing well. He complains of decreasing hearing and cerumen. Also complains of some increased bruising and headaches recently and dry itchy skin. No chest pain or palpitations. No shortness or breath GI or GU concerns. Taking medications as prescribed.  Past Medical History  Diagnosis Date  . SKIN CANCER, HX OF 03/01/2010  . PUD, HX OF 03/01/2010  . PROSTATITIS, ACUTE 04/01/2010  . PROSTATE CANCER 03/01/2010  . Nocturia 03/01/2010  . HYPERLIPIDEMIA 03/01/2010  . ELEVATED BP READING WITHOUT DX HYPERTENSION 03/01/2010  . COLONIC POLYPS, HX OF 03/01/2010  . CATARACT EXTRACTIONS, BILATERAL, HX OF 03/01/2010  . BACK PAIN, LUMBAR, WITH RADICULOPATHY 04/01/2010  . Pulmonary nodule 09/14/2010  . HTN (hypertension) 10/05/2010  . Sinusitis 03/30/2011  . Dermatitis 03/30/2011  . Fatigue 05/03/2011  . Ascending aorta dilatation 09/02/2011  . SOB (shortness of breath) 09/04/2011  . Tremor 12/23/2011  . Anemia 12/23/2011  . Bruising 07/30/2012    Past Surgical History  Procedure Laterality Date  . Abdominal surgery  1960    Correction of PUD and hiatal hernia  . Tonsillectomy and adenoidectomy    . Colonoscopy w/ biopsies and polypectomy  1980's    several, benign  . Mose surgery to scalp      Family History  Problem Relation Age of Onset  . Stroke Mother   . Hip fracture Mother   . Lung disease Father     smoker  . Cancer Sister     lung/ smoker  . Heart disease Brother   . Heart attack Brother   . Alcohol abuse Maternal Grandfather   . Cancer Paternal Grandmother     colon  . Pneumonia Brother   . Alcohol abuse Brother   . Achalasia Son     History   Social  History  . Marital Status: Married    Spouse Name: N/A    Number of Children: N/A  . Years of Education: N/A   Occupational History  . Not on file.   Social History Main Topics  . Smoking status: Never Smoker   . Smokeless tobacco: Never Used  . Alcohol Use: No  . Drug Use: Not on file  . Sexually Active: Not on file   Other Topics Concern  . Not on file   Social History Narrative  . No narrative on file    Current Outpatient Prescriptions on File Prior to Visit  Medication Sig Dispense Refill  . amLODipine (NORVASC) 2.5 MG tablet Take 1 tablet (2.5 mg total) by mouth daily.  90 tablet  3  . aspirin 325 MG tablet Take 1 tablet (325 mg total) by mouth daily.  30 tablet  0  . Clobetasol Propionate 0.05 % lotion Apply sparingly as needed up to twice daily  118 mL  2  . Ferrous Fumarate-Folic Acid (HEMOCYTE-F) 324-1 MG TABS Take 1 tablet by mouth daily.  30 each  1  . megestrol (MEGACE) 40 MG tablet Take 40 mg by mouth 2 (two) times daily.      . metoprolol tartrate (LOPRESSOR) 25 MG tablet Take 1 tablet (25 mg total) by mouth  2 (two) times daily.  60 tablet  5  . simvastatin (ZOCOR) 20 MG tablet Take 1 tablet (20 mg total) by mouth at bedtime.  90 tablet  3  . Tamsulosin HCl (FLOMAX) 0.4 MG CAPS Take 1 capsule (0.4 mg total) by mouth daily.  90 capsule  3   No current facility-administered medications on file prior to visit.    Allergies  Allergen Reactions  . Rofecoxib     Review of Systems  Review of Systems  Constitutional: Negative for fever and malaise/fatigue.  HENT: Negative for congestion.   Eyes: Negative for discharge.  Respiratory: Negative for shortness of breath.   Cardiovascular: Negative for chest pain, palpitations and leg swelling.  Gastrointestinal: Negative for nausea, abdominal pain and diarrhea.  Genitourinary: Negative for dysuria.  Musculoskeletal: Negative for falls.  Skin: Negative for rash.  Neurological: Negative for loss of consciousness  and headaches.  Endo/Heme/Allergies: Negative for polydipsia.  Psychiatric/Behavioral: Negative for depression and suicidal ideas. The patient is not nervous/anxious and does not have insomnia.     Objective  BP 110/62  Pulse 65  Temp(Src) 97.9 F (36.6 C) (Oral)  Ht 5\' 6"  (1.676 m)  Wt 142 lb 1.9 oz (64.465 kg)  BMI 22.95 kg/m2  SpO2 94%  Physical Exam  Physical Exam  Constitutional: He is oriented to person, place, and time and well-developed, well-nourished, and in no distress. No distress.  HENT:  Head: Normocephalic and atraumatic.  Eyes: Conjunctivae are normal.  Neck: Neck supple. No thyromegaly present.  Cardiovascular: Normal rate and regular rhythm.  Exam reveals no gallop.   No murmur heard. Pulmonary/Chest: Effort normal and breath sounds normal. No respiratory distress.  Abdominal: He exhibits no distension and no mass. There is no tenderness.  Musculoskeletal: He exhibits no edema.  Neurological: He is alert and oriented to person, place, and time.  Skin: Skin is warm.  Psychiatric: Memory, affect and judgment normal.    Lab Results  Component Value Date   TSH 2.58 12/21/2011   Lab Results  Component Value Date   WBC 9.1 04/26/2012   HGB 12.7* 04/26/2012   HCT 38.4* 04/26/2012   MCV 89.5 04/26/2012   PLT 291 04/26/2012   Lab Results  Component Value Date   CREATININE 1.62* 04/26/2012   BUN 24* 04/26/2012   NA 139 04/26/2012   K 4.7 04/26/2012   CL 112 04/26/2012   CO2 23 04/26/2012   Lab Results  Component Value Date   ALT 8 12/21/2011   AST 14 12/21/2011   ALKPHOS 64 12/21/2011   BILITOT 0.4 12/21/2011   Lab Results  Component Value Date   CHOL 116 12/21/2011   Lab Results  Component Value Date   HDL 25.70* 12/21/2011   Lab Results  Component Value Date   LDLCALC 58 12/21/2011   Lab Results  Component Value Date   TRIG 161.0* 12/21/2011   Lab Results  Component Value Date   CHOLHDL 5 12/21/2011     Assessment & Plan  HTN  (hypertension) Well controlled on current meds no changes  HYPERLIPIDEMIA Tolerating Zocor. Avoid trans fats  Bruising Check cbc ad pt/inr today  Anemia Repeat cbc today

## 2012-07-31 LAB — CBC
HCT: 38 % — ABNORMAL LOW (ref 39.0–52.0)
Hemoglobin: 13.1 g/dL (ref 13.0–17.0)
MCH: 30.9 pg (ref 26.0–34.0)
RBC: 4.24 MIL/uL (ref 4.22–5.81)

## 2012-07-31 LAB — RENAL FUNCTION PANEL
Albumin: 4.1 g/dL (ref 3.5–5.2)
BUN: 26 mg/dL — ABNORMAL HIGH (ref 6–23)
CO2: 19 mEq/L (ref 19–32)
Glucose, Bld: 95 mg/dL (ref 70–99)
Sodium: 141 mEq/L (ref 135–145)

## 2012-07-31 LAB — PROTIME-INR: Prothrombin Time: 13.8 seconds (ref 11.6–15.2)

## 2012-09-05 ENCOUNTER — Inpatient Hospital Stay (HOSPITAL_COMMUNITY)
Admission: EM | Admit: 2012-09-05 | Discharge: 2012-09-12 | DRG: 175 | Disposition: A | Discharge status: Home Health | Payer: Medicare Other | Attending: Family Medicine | Admitting: Family Medicine

## 2012-09-05 ENCOUNTER — Emergency Department (HOSPITAL_COMMUNITY): Payer: Medicare Other

## 2012-09-05 ENCOUNTER — Encounter (HOSPITAL_COMMUNITY): Payer: Self-pay | Admitting: Emergency Medicine

## 2012-09-05 ENCOUNTER — Inpatient Hospital Stay (HOSPITAL_COMMUNITY): Payer: Medicare Other

## 2012-09-05 DIAGNOSIS — N189 Chronic kidney disease, unspecified: Secondary | ICD-10-CM

## 2012-09-05 DIAGNOSIS — R0602 Shortness of breath: Secondary | ICD-10-CM

## 2012-09-05 DIAGNOSIS — N183 Chronic kidney disease, stage 3 unspecified: Secondary | ICD-10-CM | POA: Diagnosis present

## 2012-09-05 DIAGNOSIS — K219 Gastro-esophageal reflux disease without esophagitis: Secondary | ICD-10-CM | POA: Diagnosis present

## 2012-09-05 DIAGNOSIS — IMO0002 Reserved for concepts with insufficient information to code with codable children: Secondary | ICD-10-CM | POA: Diagnosis present

## 2012-09-05 DIAGNOSIS — R911 Solitary pulmonary nodule: Secondary | ICD-10-CM

## 2012-09-05 DIAGNOSIS — J189 Pneumonia, unspecified organism: Secondary | ICD-10-CM | POA: Diagnosis present

## 2012-09-05 DIAGNOSIS — N179 Acute kidney failure, unspecified: Secondary | ICD-10-CM | POA: Diagnosis present

## 2012-09-05 DIAGNOSIS — I5030 Unspecified diastolic (congestive) heart failure: Secondary | ICD-10-CM | POA: Diagnosis present

## 2012-09-05 DIAGNOSIS — N400 Enlarged prostate without lower urinary tract symptoms: Secondary | ICD-10-CM | POA: Diagnosis present

## 2012-09-05 DIAGNOSIS — Z8711 Personal history of peptic ulcer disease: Secondary | ICD-10-CM

## 2012-09-05 DIAGNOSIS — E785 Hyperlipidemia, unspecified: Secondary | ICD-10-CM | POA: Diagnosis present

## 2012-09-05 DIAGNOSIS — I10 Essential (primary) hypertension: Secondary | ICD-10-CM

## 2012-09-05 DIAGNOSIS — R0682 Tachypnea, not elsewhere classified: Secondary | ICD-10-CM

## 2012-09-05 DIAGNOSIS — E860 Dehydration: Secondary | ICD-10-CM | POA: Diagnosis present

## 2012-09-05 DIAGNOSIS — I2699 Other pulmonary embolism without acute cor pulmonale: Principal | ICD-10-CM | POA: Diagnosis present

## 2012-09-05 DIAGNOSIS — I129 Hypertensive chronic kidney disease with stage 1 through stage 4 chronic kidney disease, or unspecified chronic kidney disease: Secondary | ICD-10-CM | POA: Diagnosis present

## 2012-09-05 DIAGNOSIS — C61 Malignant neoplasm of prostate: Secondary | ICD-10-CM | POA: Diagnosis present

## 2012-09-05 DIAGNOSIS — G4700 Insomnia, unspecified: Secondary | ICD-10-CM | POA: Diagnosis present

## 2012-09-05 DIAGNOSIS — K5900 Constipation, unspecified: Secondary | ICD-10-CM | POA: Diagnosis not present

## 2012-09-05 LAB — PROTIME-INR: INR: 1.37 (ref 0.00–1.49)

## 2012-09-05 LAB — CBC WITH DIFFERENTIAL/PLATELET
Basophils Relative: 0 % (ref 0–1)
Eosinophils Absolute: 0 10*3/uL (ref 0.0–0.7)
Eosinophils Relative: 0 % (ref 0–5)
Hemoglobin: 12.9 g/dL — ABNORMAL LOW (ref 13.0–17.0)
MCH: 31.2 pg (ref 26.0–34.0)
MCHC: 34 g/dL (ref 30.0–36.0)
MCV: 91.8 fL (ref 78.0–100.0)
Monocytes Absolute: 2.1 10*3/uL — ABNORMAL HIGH (ref 0.1–1.0)
Monocytes Relative: 11 % (ref 3–12)
Neutrophils Relative %: 84 % — ABNORMAL HIGH (ref 43–77)

## 2012-09-05 LAB — CBC
Hemoglobin: 12.4 g/dL — ABNORMAL LOW (ref 13.0–17.0)
MCH: 31.4 pg (ref 26.0–34.0)
MCHC: 34.3 g/dL (ref 30.0–36.0)
RDW: 13.5 % (ref 11.5–15.5)

## 2012-09-05 LAB — COMPREHENSIVE METABOLIC PANEL
Albumin: 2.9 g/dL — ABNORMAL LOW (ref 3.5–5.2)
BUN: 24 mg/dL — ABNORMAL HIGH (ref 6–23)
Calcium: 9.4 mg/dL (ref 8.4–10.5)
Creatinine, Ser: 1.55 mg/dL — ABNORMAL HIGH (ref 0.50–1.35)
GFR calc Af Amer: 46 mL/min — ABNORMAL LOW (ref >90)
Glucose, Bld: 126 mg/dL — ABNORMAL HIGH (ref 70–99)
Potassium: 4.2 mEq/L (ref 3.5–5.1)
Total Protein: 6.9 g/dL (ref 6.0–8.3)

## 2012-09-05 LAB — APTT: aPTT: 42 seconds — ABNORMAL HIGH (ref 24–37)

## 2012-09-05 LAB — PRO B NATRIURETIC PEPTIDE: Pro B Natriuretic peptide (BNP): 829.6 pg/mL — ABNORMAL HIGH (ref 0–450)

## 2012-09-05 LAB — POCT I-STAT TROPONIN I: Troponin i, poc: 0.01 ng/mL (ref 0.00–0.08)

## 2012-09-05 LAB — MRSA PCR SCREENING: MRSA by PCR: NEGATIVE

## 2012-09-05 MED ORDER — SODIUM CHLORIDE 0.9 % IV SOLN: 250.0000 mL | INTRAVENOUS | Status: DC | PRN

## 2012-09-05 MED ORDER — DEXTROSE 5 % IV SOLN: 1.0000 g | INTRAVENOUS | Status: DC

## 2012-09-05 MED ORDER — MORPHINE SULFATE 2 MG/ML IJ SOLN
2.0000 mg | Freq: Once | INTRAMUSCULAR | Status: AC
  Administered 2012-09-05: 2 mg via INTRAVENOUS
  Filled 2012-09-05: qty 1

## 2012-09-05 MED ORDER — SODIUM CHLORIDE 0.9 % IJ SOLN
3.0000 mL | Freq: Two times a day (BID) | INTRAMUSCULAR | Status: DC
  Administered 2012-09-06 – 2012-09-10 (×5): 3 mL via INTRAVENOUS

## 2012-09-05 MED ORDER — IOHEXOL 350 MG/ML SOLN
100.0000 mL | Freq: Once | INTRAVENOUS | Status: AC | PRN
  Administered 2012-09-05: 80 mL via INTRAVENOUS

## 2012-09-05 MED ORDER — DEXTROSE 5 % IV SOLN
500.0000 mg | Freq: Once | INTRAVENOUS | Status: AC
  Administered 2012-09-05: 500 mg via INTRAVENOUS
  Filled 2012-09-05: qty 500

## 2012-09-05 MED ORDER — DEXTROSE 5 % IV SOLN
1.0000 g | Freq: Once | INTRAVENOUS | Status: AC
  Administered 2012-09-05: 1 g via INTRAVENOUS
  Filled 2012-09-05: qty 10

## 2012-09-05 MED ORDER — ENOXAPARIN SODIUM 80 MG/0.8ML ~~LOC~~ SOLN
70.0000 mg | SUBCUTANEOUS | Status: DC
  Filled 2012-09-05: qty 0.8

## 2012-09-05 MED ORDER — SODIUM CHLORIDE 0.9 % IJ SOLN: 3.0000 mL | INTRAMUSCULAR | Status: DC | PRN

## 2012-09-05 MED ORDER — ACETAMINOPHEN 650 MG RE SUPP: 650.0000 mg | Freq: Four times a day (QID) | RECTAL | Status: DC | PRN

## 2012-09-05 MED ORDER — SODIUM CHLORIDE 0.9 % IV SOLN
INTRAVENOUS | Status: DC
  Administered 2012-09-08 – 2012-09-11 (×2): via INTRAVENOUS

## 2012-09-05 MED ORDER — ACETAMINOPHEN 325 MG PO TABS
650.0000 mg | ORAL_TABLET | Freq: Four times a day (QID) | ORAL | Status: DC | PRN
  Administered 2012-09-05 – 2012-09-12 (×3): 650 mg via ORAL
  Filled 2012-09-05 (×3): qty 2

## 2012-09-05 MED ORDER — DEXTROSE 5 % IV SOLN
500.0000 mg | INTRAVENOUS | Status: AC
  Administered 2012-09-06 – 2012-09-08 (×3): 500 mg via INTRAVENOUS
  Filled 2012-09-05 (×4): qty 500

## 2012-09-05 MED ORDER — ENOXAPARIN SODIUM 60 MG/0.6ML ~~LOC~~ SOLN
60.0000 mg | SUBCUTANEOUS | Status: AC
  Administered 2012-09-05: 60 mg via SUBCUTANEOUS
  Filled 2012-09-05: qty 0.6

## 2012-09-05 MED ORDER — HYDROMORPHONE HCL PF 1 MG/ML IJ SOLN
0.5000 mg | Freq: Four times a day (QID) | INTRAMUSCULAR | Status: DC | PRN
  Administered 2012-09-05 – 2012-09-06 (×2): 0.5 mg via INTRAVENOUS
  Filled 2012-09-05 (×2): qty 1

## 2012-09-05 MED ORDER — ENOXAPARIN SODIUM 60 MG/0.6ML ~~LOC~~ SOLN
60.0000 mg | Freq: Two times a day (BID) | SUBCUTANEOUS | Status: DC
  Administered 2012-09-06 – 2012-09-07 (×4): 60 mg via SUBCUTANEOUS
  Filled 2012-09-05 (×7): qty 0.6

## 2012-09-05 MED ORDER — LEVALBUTEROL HCL 0.63 MG/3ML IN NEBU
0.6300 mg | INHALATION_SOLUTION | Freq: Four times a day (QID) | RESPIRATORY_TRACT | Status: DC
  Administered 2012-09-05 – 2012-09-06 (×5): 0.63 mg via RESPIRATORY_TRACT
  Filled 2012-09-05 (×9): qty 3

## 2012-09-05 MED ORDER — ENOXAPARIN SODIUM 80 MG/0.8ML ~~LOC~~ SOLN
70.0000 mg | Freq: Two times a day (BID) | SUBCUTANEOUS | Status: DC
  Filled 2012-09-05: qty 0.8

## 2012-09-05 MED ORDER — DEXTROSE 5 % IV SOLN
1.0000 g | INTRAVENOUS | Status: AC
  Administered 2012-09-06 – 2012-09-08 (×3): 1 g via INTRAVENOUS
  Filled 2012-09-05 (×4): qty 10

## 2012-09-05 MED ORDER — AZITHROMYCIN 500 MG IV SOLR: 500.0000 mg | INTRAVENOUS | Status: DC

## 2012-09-05 NOTE — ED Notes (Signed)
/  o right sided chest pain, onset three days ago.  Patient is in obvious distress.  States simialr pain recently but resolved after four days.  Friend in the room at bedside

## 2012-09-05 NOTE — ED Provider Notes (Signed)
TIME SEEN: 3:04 PM  CHIEF COMPLAINT: Chest pain, shortness of breath  HPI: Patient is an 79 year old male with a history of hypertension, hyperlipidemia who presents to the emergency department with 4 days of constant sharp, right-sided chest pain and right-sided back pain that is worse with movement and palpation. He's had similar symptoms 6 months ago without a formal diagnosis as he states a result of his and he is not seen by physician. He states he has also had shortness of breath has been present for the past 2 years but does not feel it is worse today. Denies any nausea, vomiting, diaphoresis, lightheadedness. No fever, cough, dysuria or hematuria. No recent falls.  PCP is Beth Blythe  ROS: See HPI Constitutional: no fever  Eyes: no drainage  ENT: no runny nose   Cardiovascular:  chest pain  Resp:  SOB  GI: no vomiting GU: no dysuria Integumentary: no rash  Allergy: no hives  Musculoskeletal: no leg swelling  Neurological: no slurred speech ROS otherwise negative  PAST MEDICAL HISTORY/PAST SURGICAL HISTORY:  Past Medical History  Diagnosis Date  . SKIN CANCER, HX OF 03/01/2010  . PUD, HX OF 03/01/2010  . PROSTATITIS, ACUTE 04/01/2010  . PROSTATE CANCER 03/01/2010  . Nocturia 03/01/2010  . HYPERLIPIDEMIA 03/01/2010  . ELEVATED BP READING WITHOUT DX HYPERTENSION 03/01/2010  . COLONIC POLYPS, HX OF 03/01/2010  . CATARACT EXTRACTIONS, BILATERAL, HX OF 03/01/2010  . BACK PAIN, LUMBAR, WITH RADICULOPATHY 04/01/2010  . Pulmonary nodule 09/14/2010  . HTN (hypertension) 10/05/2010  . Sinusitis 03/30/2011  . Dermatitis 03/30/2011  . Fatigue 05/03/2011  . Ascending aorta dilatation 09/02/2011  . SOB (shortness of breath) 09/04/2011  . Tremor 12/23/2011  . Anemia 12/23/2011  . Bruising 07/30/2012    MEDICATIONS:  Prior to Admission medications   Medication Sig Start Date End Date Taking? Authorizing Provider  amLODipine (NORVASC) 2.5 MG tablet Take 1 tablet (2.5 mg total) by mouth daily. 09/02/11   Bradd Canary, MD  aspirin 325 MG tablet Take 1 tablet (325 mg total) by mouth daily. 09/02/11   Bradd Canary, MD  Clobetasol Propionate 0.05 % lotion Apply sparingly as needed up to twice daily 04/26/12   Bradd Canary, MD  Ferrous Fumarate-Folic Acid (HEMOCYTE-F) 324-1 MG TABS Take 1 tablet by mouth daily. 12/23/11   Bradd Canary, MD  megestrol (MEGACE) 40 MG tablet Take 40 mg by mouth 2 (two) times daily.    Historical Provider, MD  metoprolol tartrate (LOPRESSOR) 25 MG tablet Take 1 tablet (25 mg total) by mouth 2 (two) times daily. 05/16/12   Bradd Canary, MD  neomycin-polymyxin-hydrocortisone (CORTISPORIN) otic solution Place 3 drops into both ears 2 (two) times daily. 07/30/12   Bradd Canary, MD  simvastatin (ZOCOR) 20 MG tablet Take 1 tablet (20 mg total) by mouth at bedtime. 09/02/11   Bradd Canary, MD  Tamsulosin HCl (FLOMAX) 0.4 MG CAPS Take 1 capsule (0.4 mg total) by mouth daily. 09/02/11   Bradd Canary, MD    ALLERGIES:  Allergies  Allergen Reactions  . Rofecoxib     SOCIAL HISTORY:  History  Substance Use Topics  . Smoking status: Never Smoker   . Smokeless tobacco: Never Used  . Alcohol Use: No    FAMILY HISTORY: Family History  Problem Relation Age of Onset  . Stroke Mother   . Hip fracture Mother   . Lung disease Father     smoker  . Cancer Sister  lung/ smoker  . Heart disease Brother   . Heart attack Brother   . Alcohol abuse Maternal Grandfather   . Cancer Paternal Grandmother     colon  . Pneumonia Brother   . Alcohol abuse Brother   . Achalasia Son     EXAM: BP 143/76  Pulse 109  Temp(Src) 97.7 F (36.5 C) (Oral)  Ht 5\' 8"  (1.727 m)  Wt 145 lb (65.772 kg)  BMI 22.05 kg/m2  SpO2 95% CONSTITUTIONAL: Alert and oriented and responds appropriately to questions. Well-appearing; well-nourished HEAD: Normocephalic EYES: Conjunctivae clear, PERRL ENT: normal nose; no rhinorrhea; moist mucous membranes; pharynx without lesions noted NECK: Supple,  no meningismus, no LAD  CARD: RRR; S1 and S2 appreciated; no murmurs, no clicks, no rubs, no gallops, patient is tender to palpation over right chest wall as well as right scapular region with no skin changes or bony deformity, crepitus, ecchymosis RESP: Patient's oxygen saturation is 98% on room air but he is tachypneic in the 40s and has crackles at bases bilaterally, no rhonchi or Rales ABD/GI: Normal bowel sounds; non-distended; soft, non-tender, no rebound, no guarding BACK:  The back appears normal and is no midline spinal tenderness, there is no CVA tenderness EXT: Normal ROM in all joints; non-tender to palpation; no edema; normal capillary refill; no cyanosis    SKIN: Normal color for age and race; warm NEURO: Moves all extremities equally PSYCH: The patient's mood and manner are appropriate. Grooming and personal hygiene are appropriate.  MEDICAL DECISION MAKING: Patient with right-sided chest pain as well as some respiratory distress. Patient does have risk factors for ACS. History of CHF. Denies any infectious symptoms. He is to contact him has increased worker breathing but no hypoxia. He denies any prior history of PE or DVT. No large joint swelling or pain. His pain in his chest is reproducible with palpation of his chest wall. We'll obtain cardiac labs, chest x-ray. Patient will likely need a CT to rule out dissection.   Date: 09/05/2012 15:09  Rate: 105  Rhythm: sinus tachycardia  QRS Axis: normal  Intervals: incomplete RBBB, 1st degree AV block  ST/T Wave abnormalities: no ischemic changes  Conduction Disutrbances: incomplete RBBB  Narrative Interpretation: Incomplete right bundle branch block, first degree AV block, no ischemic changes, tachycardic, unchanged compared to prior EKG on 08/17/2001     ED PROGRESS:  5:48 PM  Pt with pneumonia seen on chest x-ray. Patient has leukocytosis with left shift. Cardiac labs are negative. Will treat for community-acquired. Blood  culture sent. Patient is still mildly tachypneic and tachycardic. Will place on oxygen.  7:55PM  Pt also has a right-sided pulmonary embolus. No dissection seen on CT. Will treat with Lovenox. Discussed with hospitalist for admission to step down.  Patient is still normotensive and not hypoxic.  Layla Maw Levii Hairfield, DO 09/05/12 [id number]

## 2012-09-05 NOTE — ED Notes (Signed)
Patient transported to CT 

## 2012-09-05 NOTE — Progress Notes (Addendum)
ANTICOAGULATION CONSULT NOTE - Initial Consult  Pharmacy Consult for enoxaparin Indication: pulmonary embolus  Allergies  Allergen Reactions  . Rofecoxib     Patient Measurements: Height: 5\' 8"  (172.7 cm) Weight: 145 lb (65.772 kg) IBW/kg (Calculated) : 68.4 Heparin Dosing Weight:   Vital Signs: Temp: 97.7 F (36.5 C) (08/13 1452) Temp src: Oral (08/13 1452) BP: 143/76 mmHg (08/13 1452) Pulse Rate: 109 (08/13 1452)  Labs:  Recent Labs  09/05/12 1607  HGB 12.9*  HCT 37.9*  PLT 248  CREATININE 1.55*    Estimated Creatinine Clearance: 33 ml/min (by C-G formula based on Cr of 1.55).   Medical History: Past Medical History  Diagnosis Date  . SKIN CANCER, HX OF 03/01/2010  . PUD, HX OF 03/01/2010  . PROSTATITIS, ACUTE 04/01/2010  . PROSTATE CANCER 03/01/2010  . Nocturia 03/01/2010  . HYPERLIPIDEMIA 03/01/2010  . ELEVATED BP READING WITHOUT DX HYPERTENSION 03/01/2010  . COLONIC POLYPS, HX OF 03/01/2010  . CATARACT EXTRACTIONS, BILATERAL, HX OF 03/01/2010  . BACK PAIN, LUMBAR, WITH RADICULOPATHY 04/01/2010  . Pulmonary nodule 09/14/2010  . HTN (hypertension) 10/05/2010  . Sinusitis 03/30/2011  . Dermatitis 03/30/2011  . Fatigue 05/03/2011  . Ascending aorta dilatation 09/02/2011  . SOB (shortness of breath) 09/04/2011  . Tremor 12/23/2011  . Anemia 12/23/2011  . Bruising 07/30/2012    Assessment: 86 YOM presents with chest pain and shortness of breath. CT angio reveals RLL PE. Orders to start lovenox   Renal: SCr = 1.55 for est CrCl = 60ml/min, CRI evident per previous labs (prev values = 1.61-1.9)  Documented wt = 65.8kg (updated to 62kg)  CBC: Hgb=12.9, plts = 248  Goal of Therapy:  Anti-Xa level 0.6-1.2 units/ml 4hrs after LMWH dose given Monitor platelets by anticoagulation protocol: Yes   Plan:   Based on current renal function, start lovenox 70mg  SQ q12h but will need to watch SCr closely and if CrCl becomes <56ml/min then change to once-daily  Check PT/INR and aPTT  now  Check CBC at minimum q72h while hospitalized  Await plans for oral anticoagulation  Juliette Alcide, PharmD, BCPS.   Pager: [phone]  09/05/2012,6:48 PM   Addendum:  Called by oncoming shift RN and stated that weight from earlier today updated to 62kg, change doseo to 60mg  SQ q12h  Juliette Alcide, PharmD, BCPS.   Pager: [phone] 09/05/2012 7:50 PM

## 2012-09-05 NOTE — ED Notes (Signed)
Room 1225 was assigned@2007 

## 2012-09-05 NOTE — ED Notes (Signed)
Informed by Radiology that pt is refusing X-rays b/c pt believes he doesn't need them.

## 2012-09-05 NOTE — H&P (Signed)
[facility] History and Physical  Sergio Anderson ZOX:[id number] DOB: 08-Feb-1926 DOA: 09/05/2012  Referring physician:  PCP: Danise Edge, MD   Chief Complaint: *Shortness of breath HPI:  79 year old male with a history of hypertension, hyperlipidemia who presents to the emergency department with 4 days of constant sharp, right-sided chest pain and right-sided back pain that is worse with movement and palpation. He's had similar symptoms 6 months ago, but the pain had spontaneously resolved. The patient states that he has had thoracic and lumbar spine pain for the last one week has not been able to get out of bed and ambulate much. He denies any history of falls. He denies any fever, cough, chills, sore throat. He denies any prior history of blood clots. In the ER the patient's father of a right-sided pulmonary embolism. He was quite tachypneic and tachycardic upon presentation to the ER        Review of Systems: negative for the following  Constitutional: Denies fever, chills, positive for diaphoresis, appetite change and fatigue.  HEENT: Denies photophobia, eye pain, redness, hearing loss, ear pain, congestion, sore throat, rhinorrhea, sneezing, mouth sores, trouble swallowing, neck pain, neck stiffness and tinnitus.  Respiratory: Positive for SOB, DOE, cough, chest tightness, and wheezing.  Cardiovascular: Denies chest pain, palpitations and leg swelling.  Gastrointestinal: Denies nausea, vomiting, abdominal pain, diarrhea, constipation, blood in stool and abdominal distention.  Genitourinary: Denies dysuria, urgency, frequency, hematuria, flank pain and difficulty urinating.  Musculoskeletal: Denies myalgias, back pain, joint swelling, arthralgias and gait problem.  Skin: Denies pallor, rash and wound.  Neurological: Denies dizziness, seizures, syncope, weakness, light-headedness, numbness and headaches.  Hematological: Denies adenopathy. Easy bruising, personal or family  bleeding history  Psychiatric/Behavioral: Denies suicidal ideation, mood changes, confusion, nervousness, sleep disturbance and agitation       Past Medical History  Diagnosis Date  . SKIN CANCER, HX OF 03/01/2010  . PUD, HX OF 03/01/2010  . PROSTATITIS, ACUTE 04/01/2010  . Nocturia 03/01/2010  . HYPERLIPIDEMIA 03/01/2010  . ELEVATED BP READING WITHOUT DX HYPERTENSION 03/01/2010  . COLONIC POLYPS, HX OF 03/01/2010  . CATARACT EXTRACTIONS, BILATERAL, HX OF 03/01/2010  . BACK PAIN, LUMBAR, WITH RADICULOPATHY 04/01/2010  . Pulmonary nodule 09/14/2010  . HTN (hypertension) 10/05/2010  . Sinusitis 03/30/2011  . Dermatitis 03/30/2011  . Fatigue 05/03/2011  . Ascending aorta dilatation 09/02/2011  . SOB (shortness of breath) 09/04/2011  . Tremor 12/23/2011  . Anemia 12/23/2011  . Bruising 07/30/2012  . PROSTATE CANCER 03/01/2010     Past Surgical History  Procedure Laterality Date  . Abdominal surgery  1960    Correction of PUD and hiatal hernia  . Tonsillectomy and adenoidectomy    . Colonoscopy w/ biopsies and polypectomy  1980's    several, benign  . Mose surgery to scalp        Social History:  reports that he has never smoked. He has never used smokeless tobacco. He reports that he does not drink alcohol. His drug history is not on file.    Allergies  Allergen Reactions  . Rofecoxib     Family History  Problem Relation Age of Onset  . Stroke Mother   . Hip fracture Mother   . Lung disease Father     smoker  . Cancer Sister     lung/ smoker  . Heart disease Brother   . Heart attack Brother   . Alcohol abuse Maternal Grandfather   . Cancer Paternal Grandmother     colon  .  Pneumonia Brother   . Alcohol abuse Brother   . Achalasia Son      Prior to Admission medications   Medication Sig Start Date End Date Taking? Authorizing Provider  amLODipine (NORVASC) 2.5 MG tablet Take 1 tablet (2.5 mg total) by mouth daily. 09/02/11   Bradd Canary, MD  Ferrous Fumarate-Folic Acid  (HEMOCYTE-F) 324-1 MG TABS Take 1 tablet by mouth daily. 12/23/11   Bradd Canary, MD  megestrol (MEGACE) 40 MG tablet Take 40 mg by mouth 2 (two) times daily.    Historical Provider, MD  metoprolol tartrate (LOPRESSOR) 25 MG tablet Take 1 tablet (25 mg total) by mouth 2 (two) times daily. 05/16/12   Bradd Canary, MD  neomycin-polymyxin-hydrocortisone (CORTISPORIN) otic solution Place 3 drops into both ears 2 (two) times daily. 07/30/12   Bradd Canary, MD  simvastatin (ZOCOR) 20 MG tablet Take 1 tablet (20 mg total) by mouth at bedtime. 09/02/11   Bradd Canary, MD  Tamsulosin HCl (FLOMAX) 0.4 MG CAPS Take 1 capsule (0.4 mg total) by mouth daily. 09/02/11   Bradd Canary, MD     Physical Exam: Filed Vitals:   09/05/12 1452 09/05/12 1920 09/05/12 1924  BP: 143/76  111/64  Pulse: 109  110  Temp: 97.7 F (36.5 C)  99.5 F (37.5 C)  TempSrc: Oral  Oral  Resp:   21  Height: 5\' 8"  (1.727 m)    Weight: 65.772 kg (145 lb) 62.052 kg (136 lb 12.8 oz)   SpO2: 95%  98%     Constitutional: Vital signs reviewed. Patient is a well-developed and well-nourished in no acute distress and cooperative with exam. Alert and oriented x3.  Head: Normocephalic and atraumatic  Ear: TM normal bilaterally  Mouth: no erythema or exudates, MMM  Eyes: PERRL, EOMI, conjunctivae normal, No scleral icterus.  Neck: Supple, Trachea midline normal ROM, No JVD, mass, thyromegaly, or carotid bruit present.  Cardiovascular: RRR, S1 normal, S2 normal, no MRG, pulses symmetric and intact bilaterally  Pulmonary/Chest: CTAB, no wheezes, rales, or rhonchi  Abdominal: Soft. Non-tender, non-distended, bowel sounds are normal, no masses, organomegaly, or guarding present.  GU: no CVA tenderness Musculoskeletal: No joint deformities, erythema, or stiffness, ROM full and no nontender Ext: no edema and no cyanosis, pulses palpable bilaterally (DP and PT)  Hematology: no cervical, inginal, or axillary adenopathy.  Neurological: A&O  x3, Strenght is normal and symmetric bilaterally, cranial nerve II-XII are grossly intact, no focal motor deficit, sensory intact to light touch bilaterally.  Skin: Warm, dry and intact. No rash, cyanosis, or clubbing.  Psychiatric: Normal mood and affect. speech and behavior is normal. Judgment and thought content normal. Cognition and memory are normal.       Labs on Admission:    Basic Metabolic Panel:  Recent Labs Lab 09/05/12 1607  NA 139  K 4.2  CL 109  CO2 18*  GLUCOSE 126*  BUN 24*  CREATININE 1.55*  CALCIUM 9.4   Liver Function Tests:  Recent Labs Lab 09/05/12 1607  AST 8  ALT <5  ALKPHOS 74  BILITOT 0.5  PROT 6.9  ALBUMIN 2.9*   No results found for this basename: LIPASE, AMYLASE,  in the last 168 hours No results found for this basename: AMMONIA,  in the last 168 hours CBC:  Recent Labs Lab 09/05/12 1607 09/05/12 1905  WBC 19.0* 18.6*  NEUTROABS 15.9*  --   HGB 12.9* 12.4*  HCT 37.9* 36.2*  MCV 91.8 91.6  PLT 248 247   Cardiac Enzymes: No results found for this basename: CKTOTAL, CKMB, CKMBINDEX, TROPONINI,  in the last 168 hours  BNP (last 3 results)  Recent Labs  09/05/12 1607  PROBNP 829.6*      CBG: No results found for this basename: GLUCAP,  in the last 168 hours  Radiological Exams on Admission: Dg Chest 2 View  09/05/2012   *RADIOLOGY REPORT*  Clinical Data: Chest pain, shortness of breath and weakness.  CHEST - 2 VIEW  Comparison: 11/28/2008.  Findings: Trachea is midline.  Heart is enlarged and accentuated by semi upright AP technique and low lung volumes.  There is right basilar airspace disease adjacent to an elevated right hemidiaphragm.  Difficult to exclude a tiny right pleural effusion. Linear scarring in the lingula.  A nodular density projects over the cardiac apex.  IMPRESSION:  1.  Right basilar airspace disease may be due to pneumonia. Difficult to exclude a tiny right pleural effusion. 2.  Small rounded density  projecting over the cardiac apex, at the left lung base.  The patient is scheduled for a CT chest the same day.   Original Report Authenticated By: Leanna Battles, M.D.   Ct Angio Chest Aortic Dissect W &/or W/o  09/05/2012   *RADIOLOGY REPORT*  Clinical Data: Right chest and back pain.  CT ANGIOGRAPHY CHEST  Technique:  Multidetector CT imaging of the chest using the standard protocol during bolus administration of intravenous contrast. Multiplanar reconstructed images including MIPs were obtained and reviewed to evaluate the vascular anatomy.  Contrast: 80mL OMNIPAQUE IOHEXOL 350 MG/ML SOLN  Comparison: 03/11/2010  Findings: The noncontrast scout shows no hyperdense crescent or mediastinal hematoma.  There is a moderate hiatal hernia. Extensive coronary calcifications.  Small right pleural effusion.  CTA shows a moderately large occlusive filling defect in the lower lobe branch of the right pulmonary artery extending into segmental branches. No additional filling defects are identified.  Adequate contrast opacification of the thoracic aorta with no evidence of dissection, aneurysm, or stenosis. There is classic 3- vessel brachiocephalic arch anatomy.  There is scattered aortic atheromatous calcifications.  There is a small right pleural effusion.  No hilar or mediastinal adenopathy.  Patchy atelectasis/consolidation in the lingula and right middle lobe. More extensive interstitial opacities and basilar airspace opacities in  the right lower lobe. Stable left renal cyst. Remainder visualized upper abdomen unremarkable.  Minimal spurring throughout the mid and lower thoracic spine.  IMPRESSION:  1.  Moderate   occlusive right lower lobe pulmonary embolus. I discussed the  test results over the telephone with Dr. Elesa Massed at the time of interpretation. 2.  Large hiatal hernia. 3.  Interstitial and basilar airspace opacities in the right lower lobe with a small right effusion. 4. Atherosclerosis, including aortic and  coronary artery disease. Please note that although the presence of coronary artery calcium documents the presence of coronary artery disease, the severity of this disease and any potential stenosis cannot be assessed on this non-gated CT examination.  Assessment for potential risk factor modification, dietary therapy or pharmacologic therapy may be warranted, if clinically indicated.   Original Report Authenticated By: D. Andria Rhein, MD    EKG: Independently reviewed.  Date: 09/05/2012 15:09  Rate: 105  Rhythm: sinus tachycardia  QRS Axis: normal  Intervals: incomplete RBBB, 1st degree AV block  ST/T Wave abnormalities: no ischemic changes  Conduction Disutrbances: incomplete RBBB  Narrative Interpretation: Incomplete right bundle branch block, first degree AV block, no ischemic changes,  tachycardic, unchanged compared to prior EKG on 08/17/2001  Assessment/Plan Active Problems:   * No active hospital problems. *  Pulmonary embolism Given a moderate-sized agreeable I will start the patient on a heparin drip May transition to xarelto which would need to be renally dosed in the morning Will admit to step down Obtain a 2-D echo to rule out right heart strain. Blood pressure is holding up quite well right now Given her elevated white count and possibility of a right lower lobe infiltrate, patient has been empirically started on Rocephin and azithromycin   Weight loss Patient states that he has lost about 8 pounds in the last 1 week primarily because of lack of appetite He has not had a recent colonoscopy His last colonoscopy was in 2012 He has stopped taking Megace although this appears on his medication list Megace known to cause  DVT/PE  Back pain Patient has chronic lumbar radiculopathy Will obtain x-rays of his thoracic and lumbar spine   Hypertension Will continue metoprolol at a lower dose   Code Status:   full Family Communication: bedside Disposition Plan: admit    Time spent: 70 mins   [facility] Pager [phone]  If 7PM-7AM, please contact night-coverage [email or web address] Password [facility] 09/05/2012, 8:08 PM

## 2012-09-05 NOTE — ED Notes (Signed)
Old ekg prtinted. Care handoff to RN.

## 2012-09-06 DIAGNOSIS — I10 Essential (primary) hypertension: Secondary | ICD-10-CM

## 2012-09-06 DIAGNOSIS — R0602 Shortness of breath: Secondary | ICD-10-CM

## 2012-09-06 DIAGNOSIS — Z8711 Personal history of peptic ulcer disease: Secondary | ICD-10-CM

## 2012-09-06 DIAGNOSIS — N189 Chronic kidney disease, unspecified: Secondary | ICD-10-CM

## 2012-09-06 DIAGNOSIS — I2699 Other pulmonary embolism without acute cor pulmonale: Principal | ICD-10-CM

## 2012-09-06 LAB — CBC
MCH: 31 pg (ref 26.0–34.0)
Platelets: 226 10*3/uL (ref 150–400)
RBC: 3.78 MIL/uL — ABNORMAL LOW (ref 4.22–5.81)
WBC: 18.4 10*3/uL — ABNORMAL HIGH (ref 4.0–10.5)

## 2012-09-06 LAB — COMPREHENSIVE METABOLIC PANEL
AST: 6 U/L (ref 0–37)
BUN: 24 mg/dL — ABNORMAL HIGH (ref 6–23)
CO2: 19 mEq/L (ref 19–32)
Calcium: 8.9 mg/dL (ref 8.4–10.5)
Chloride: 108 mEq/L (ref 96–112)
Creatinine, Ser: 1.44 mg/dL — ABNORMAL HIGH (ref 0.50–1.35)
GFR calc Af Amer: 50 mL/min — ABNORMAL LOW (ref >90)
GFR calc non Af Amer: 43 mL/min — ABNORMAL LOW (ref >90)
Glucose, Bld: 132 mg/dL — ABNORMAL HIGH (ref 70–99)
Total Bilirubin: 0.3 mg/dL (ref 0.3–1.2)

## 2012-09-06 LAB — STREP PNEUMONIAE URINARY ANTIGEN: Strep Pneumo Urinary Antigen: NEGATIVE

## 2012-09-06 LAB — URINALYSIS, ROUTINE W REFLEX MICROSCOPIC
Bilirubin Urine: NEGATIVE
Ketones, ur: NEGATIVE mg/dL
Nitrite: NEGATIVE
Protein, ur: 100 mg/dL — AB
Urobilinogen, UA: 1 mg/dL (ref 0.0–1.0)

## 2012-09-06 LAB — URINE MICROSCOPIC-ADD ON

## 2012-09-06 LAB — TSH: TSH: 1.228 u[IU]/mL (ref 0.350–4.500)

## 2012-09-06 MED ORDER — SIMVASTATIN 20 MG PO TABS
20.0000 mg | ORAL_TABLET | Freq: Every day | ORAL | Status: DC
  Administered 2012-09-06 – 2012-09-11 (×6): 20 mg via ORAL
  Filled 2012-09-06 (×8): qty 1

## 2012-09-06 MED ORDER — ADULT MULTIVITAMIN W/MINERALS CH
1.0000 | ORAL_TABLET | Freq: Every day | ORAL | Status: DC
  Administered 2012-09-06 – 2012-09-12 (×7): 1 via ORAL
  Filled 2012-09-06 (×7): qty 1

## 2012-09-06 MED ORDER — CLOBETASOL PROPIONATE 0.05 % EX LOTN: 1.0000 "application " | TOPICAL_LOTION | Freq: Every day | CUTANEOUS | Status: DC

## 2012-09-06 MED ORDER — LEVALBUTEROL HCL 0.63 MG/3ML IN NEBU
0.6300 mg | INHALATION_SOLUTION | Freq: Two times a day (BID) | RESPIRATORY_TRACT | Status: DC
  Administered 2012-09-07 – 2012-09-09 (×5): 0.63 mg via RESPIRATORY_TRACT
  Filled 2012-09-06 (×11): qty 3

## 2012-09-06 MED ORDER — TAMSULOSIN HCL 0.4 MG PO CAPS
0.4000 mg | ORAL_CAPSULE | Freq: Every day | ORAL | Status: DC
  Administered 2012-09-06 – 2012-09-12 (×7): 0.4 mg via ORAL
  Filled 2012-09-06 (×7): qty 1

## 2012-09-06 MED ORDER — METOPROLOL TARTRATE 12.5 MG HALF TABLET: 12.5000 mg | ORAL_TABLET | Freq: Two times a day (BID) | ORAL | Status: DC

## 2012-09-06 MED ORDER — MORPHINE SULFATE 2 MG/ML IJ SOLN
1.0000 mg | INTRAMUSCULAR | Status: DC | PRN
  Administered 2012-09-06 – 2012-09-11 (×11): 1 mg via INTRAVENOUS
  Filled 2012-09-06 (×13): qty 1

## 2012-09-06 MED ORDER — CLOBETASOL PROPIONATE 0.05 % EX LOTN
1.0000 "application " | TOPICAL_LOTION | Freq: Every day | CUTANEOUS | Status: DC
  Administered 2012-09-06 – 2012-09-10 (×4): 1 via CUTANEOUS
  Administered 2012-09-11: 22:00:00 via CUTANEOUS

## 2012-09-06 MED ORDER — ASPIRIN 325 MG PO TABS: 325.0000 mg | ORAL_TABLET | Freq: Every day | ORAL | Status: DC

## 2012-09-06 MED ORDER — PANTOPRAZOLE SODIUM 40 MG PO TBEC
40.0000 mg | DELAYED_RELEASE_TABLET | Freq: Every day | ORAL | Status: DC
  Administered 2012-09-06 – 2012-09-07 (×2): 40 mg via ORAL
  Filled 2012-09-06 (×2): qty 1

## 2012-09-06 MED ORDER — ENSURE COMPLETE PO LIQD
237.0000 mL | Freq: Two times a day (BID) | ORAL | Status: DC
  Administered 2012-09-06 – 2012-09-12 (×6): 237 mL via ORAL

## 2012-09-06 NOTE — Progress Notes (Signed)
Stopped to see patient at request of nursing staff. Patient soundly sleeping. Will stop back later.

## 2012-09-06 NOTE — Progress Notes (Signed)
[facility] PROGRESS NOTE  Sergio Anderson ZOX:[id number] DOB: Mar 25, 1926 DOA: 09/05/2012 PCP: Danise Edge, MD  Assessment/Plan: 1-CP/SOB: secondary to PNA and PE -continue lovenox -follow 2-D echo -continue zithromax and rocephin -will transfer to telemetry bed -continue PRN oxygen supplementation and nebulizer -will check strep pneumonia antigen in urine and also legionella antigen -EKG and cardiac enzymes negative for ACS  2-PE:continue lovenox. With plans to transition to xarelto when medically stable.  3-HTN: currently soft, but stable. Will hold lopressor  4-BPH: continue flomax  5-HLD: continue statins  6-GERD: continue PPI  7-Acute on chronic renal failure (stage 3 with Cr baseline of 1.3-1.4): secondary to dehydration most likely and decrease perfusion with low BP. -Cr. Improving with IVF's -hold antihypertensive agents -will check UA -BMET in am  8-Back pain and wekaness: will ask PT/OT to evaluate  Code Status: Full Family Communication: no family at bedside Disposition Plan: transfer to telemetry   Consultants:  none  Procedures:  See below for x-ray reports  Antibiotics:  Rocephin 8/13  zithromax 8/13  HPI/Subjective: Afebrile, no CP, no SOB  Objective: Filed Vitals:   09/06/12 0700  BP: 107/56  Pulse: 78  Temp:   Resp: 17    Intake/Output Summary (Last 24 hours) at 09/06/12 1016 Last data filed at 09/06/12 0600  Gross per 24 hour  Intake   1050 ml  Output      0 ml  Net   1050 ml   Filed Weights   09/05/12 1452 09/05/12 1920  Weight: 65.772 kg (145 lb) 62.052 kg (136 lb 12.8 oz)    Exam:   General:  Feeling better, pain improved; no fever  Cardiovascular: S1 and S2, nor urbs or gallops  Respiratory: CTA bilaterally  Abdomen: soft, NT, ND, positive BS  Musculoskeletal: pain with movements on his back  Neuro:non focal, AAOX3  Data Reviewed: Basic Metabolic Panel:  Recent Labs Lab 09/05/12 1607  09/06/12 0215  NA 139 134*  K 4.2 4.0  CL 109 108  CO2 18* 19  GLUCOSE 126* 132*  BUN 24* 24*  CREATININE 1.55* 1.44*  CALCIUM 9.4 8.9   Liver Function Tests:  Recent Labs Lab 09/05/12 1607 09/06/12 0215  AST 8 6  ALT <5 <5  ALKPHOS 74 66  BILITOT 0.5 0.3  PROT 6.9 5.9*  ALBUMIN 2.9* 2.3*   CBC:  Recent Labs Lab 09/05/12 1607 09/05/12 1905 09/06/12 0215  WBC 19.0* 18.6* 18.4*  NEUTROABS 15.9*  --   --   HGB 12.9* 12.4* 11.7*  HCT 37.9* 36.2* 34.4*  MCV 91.8 91.6 91.0  PLT 248 247 226   Cardiac Enzymes:  Recent Labs Lab 09/05/12 1945 09/06/12 0215 09/06/12 0900  TROPONINI <0.30 <0.30 <0.30   BNP (last 3 results)  Recent Labs  09/05/12 1607  PROBNP 829.6*    Recent Results (from the past 240 hour(s))  MRSA PCR SCREENING     Status: None   Collection Time    09/05/12  8:55 PM      Result Value Range Status   MRSA by PCR NEGATIVE  NEGATIVE Final   Comment:            The GeneXpert MRSA Assay (FDA     approved for NASAL specimens     only), is one component of a     comprehensive MRSA colonization     surveillance program. It is not     intended to diagnose MRSA     infection nor to guide  or     monitor treatment for     MRSA infections.     Studies: Dg Chest 2 View  09/05/2012   *RADIOLOGY REPORT*  Clinical Data: Chest pain, shortness of breath and weakness.  CHEST - 2 VIEW  Comparison: 11/28/2008.  Findings: Trachea is midline.  Heart is enlarged and accentuated by semi upright AP technique and low lung volumes.  There is right basilar airspace disease adjacent to an elevated right hemidiaphragm.  Difficult to exclude a tiny right pleural effusion. Linear scarring in the lingula.  A nodular density projects over the cardiac apex.  IMPRESSION:  1.  Right basilar airspace disease may be due to pneumonia. Difficult to exclude a tiny right pleural effusion. 2.  Small rounded density projecting over the cardiac apex, at the left lung base.  The  patient is scheduled for a CT chest the same day.   Original Report Authenticated By: Leanna Battles, M.D.   Ct Angio Chest Aortic Dissect W &/or W/o  09/05/2012   *RADIOLOGY REPORT*  Clinical Data: Right chest and back pain.  CT ANGIOGRAPHY CHEST  Technique:  Multidetector CT imaging of the chest using the standard protocol during bolus administration of intravenous contrast. Multiplanar reconstructed images including MIPs were obtained and reviewed to evaluate the vascular anatomy.  Contrast: 80mL OMNIPAQUE IOHEXOL 350 MG/ML SOLN  Comparison: 03/11/2010  Findings: The noncontrast scout shows no hyperdense crescent or mediastinal hematoma.  There is a moderate hiatal hernia. Extensive coronary calcifications.  Small right pleural effusion.  CTA shows a moderately large occlusive filling defect in the lower lobe branch of the right pulmonary artery extending into segmental branches. No additional filling defects are identified.  Adequate contrast opacification of the thoracic aorta with no evidence of dissection, aneurysm, or stenosis. There is classic 3- vessel brachiocephalic arch anatomy.  There is scattered aortic atheromatous calcifications.  There is a small right pleural effusion.  No hilar or mediastinal adenopathy.  Patchy atelectasis/consolidation in the lingula and right middle lobe. More extensive interstitial opacities and basilar airspace opacities in  the right lower lobe. Stable left renal cyst. Remainder visualized upper abdomen unremarkable.  Minimal spurring throughout the mid and lower thoracic spine.  IMPRESSION:  1.  Moderate   occlusive right lower lobe pulmonary embolus. I discussed the  test results over the telephone with Dr. Elesa Massed at the time of interpretation. 2.  Large hiatal hernia. 3.  Interstitial and basilar airspace opacities in the right lower lobe with a small right effusion. 4. Atherosclerosis, including aortic and coronary artery disease. Please note that although the presence  of coronary artery calcium documents the presence of coronary artery disease, the severity of this disease and any potential stenosis cannot be assessed on this non-gated CT examination.  Assessment for potential risk factor modification, dietary therapy or pharmacologic therapy may be warranted, if clinically indicated.   Original Report Authenticated By: D. Andria Rhein, MD    Scheduled Meds: . azithromycin  500 mg Intravenous Q24H  . cefTRIAXone (ROCEPHIN)  IV  1 g Intravenous Q24H  . enoxaparin (LOVENOX) injection  60 mg Subcutaneous Q12H  . levalbuterol  0.63 mg Nebulization Q6H  . simvastatin  20 mg Oral QHS  . sodium chloride  3 mL Intravenous Q12H  . tamsulosin  0.4 mg Oral Daily   Continuous Infusions: . sodium chloride      Time spent: >30 minutes    Xavien Dauphinais  [facility] Pager [phone]. If 7PM-7AM, please contact night-coverage at [email or web address],  password [facility] 09/06/2012, 10:16 AM  LOS: 1 day

## 2012-09-06 NOTE — Progress Notes (Signed)
INITIAL NUTRITION ASSESSMENT  DOCUMENTATION CODES Per approved criteria  -Not Applicable   INTERVENTION: Provide Ensure Complete BID Provide Multivitamin with minerals daily Encourage PO inake  NUTRITION DIAGNOSIS: Inadequate oral intake related to pain as evidenced by pt's report of eating poorly for past 4-5 days, PO <25% of meal and 4% wt loss.   Goal: Pt to meet >/= 90% of their estimated nutrition needs   Monitor:  PO intake Weight Labs  Reason for Assessment: Malnutrition Screening Tool, score of 2  79 y.o. male  Admitting Dx: Shortness of Breath  ASSESSMENT: 79 year old male with a history of hypertension, hyperlipidemia who presents to the emergency department with 4 days of constant sharp, right-sided chest pain and right-sided back pain that is worse with movement and palpation.  Pt reports that he has not been eating much for the past 4-5 days due to pain. Pt was NPO this morning, diet advanced to heart healthy; lunch tray was at bedside and it appears that pt only ate about 25% of meal. Pt reports that he usually weighs between 145 and 150 lbs and he usually eats 2 meals and snacks daily. He does not usually drink nutritional supplements but, is willing to drink Ensure during this hospitalization. Encourage pt to eat >/= 50% of 3 meals daily and drink Ensure BID in between meals. Pt denies any dysphagia.   Height: Ht Readings from Last 1 Encounters:  09/05/12 5\' 8"  (1.727 m)    Weight: Wt Readings from Last 1 Encounters:  09/05/12 136 lb 12.8 oz (62.052 kg)    Ideal Body Weight: 154 lbs  % Ideal Body Weight: 88%  Wt Readings from Last 10 Encounters:  09/05/12 136 lb 12.8 oz (62.052 kg)  07/30/12 142 lb 1.9 oz (64.465 kg)  04/26/12 144 lb 1.3 oz (65.354 kg)  02/01/12 144 lb 1.9 oz (65.372 kg)  12/21/11 146 lb 6.4 oz (66.407 kg)  10/28/11 146 lb 6.4 oz (66.407 kg)  10/24/11 145 lb 12.8 oz (66.134 kg)  09/02/11 145 lb 12.8 oz (66.134 kg)  06/22/11 143  lb (64.864 kg)  05/03/11 144 lb (65.318 kg)    Usual Body Weight: 145-150 lbs  % Usual Body Weight: 91-94%  BMI:  Body mass index is 20.81 kg/(m^2).  Estimated Nutritional Needs: Kcal: 1450-1690 Protein: 62-74 grams Fluid: >/= 1.8 L  Skin: dry, intact  Diet Order: Cardiac  EDUCATION NEEDS: -No education needs identified at this time   Intake/Output Summary (Last 24 hours) at 09/06/12 1313 Last data filed at 09/06/12 1300  Gross per 24 hour  Intake   1540 ml  Output    125 ml  Net   1415 ml    Last BM: WDL  Labs:   Recent Labs Lab 09/05/12 1607 09/06/12 0215  NA 139 134*  K 4.2 4.0  CL 109 108  CO2 18* 19  BUN 24* 24*  CREATININE 1.55* 1.44*  CALCIUM 9.4 8.9  GLUCOSE 126* 132*    CBG (last 3)  No results found for this basename: GLUCAP,  in the last 72 hours  Scheduled Meds: . azithromycin  500 mg Intravenous Q24H  . cefTRIAXone (ROCEPHIN)  IV  1 g Intravenous Q24H  . enoxaparin (LOVENOX) injection  60 mg Subcutaneous Q12H  . levalbuterol  0.63 mg Nebulization Q6H  . pantoprazole  40 mg Oral Daily  . simvastatin  20 mg Oral QHS  . sodium chloride  3 mL Intravenous Q12H  . tamsulosin  0.4 mg Oral Daily  Continuous Infusions: . sodium chloride      Past Medical History  Diagnosis Date  . SKIN CANCER, HX OF 03/01/2010  . PUD, HX OF 03/01/2010  . PROSTATITIS, ACUTE 04/01/2010  . Nocturia 03/01/2010  . HYPERLIPIDEMIA 03/01/2010  . ELEVATED BP READING WITHOUT DX HYPERTENSION 03/01/2010  . COLONIC POLYPS, HX OF 03/01/2010  . CATARACT EXTRACTIONS, BILATERAL, HX OF 03/01/2010  . BACK PAIN, LUMBAR, WITH RADICULOPATHY 04/01/2010  . Pulmonary nodule 09/14/2010  . HTN (hypertension) 10/05/2010  . Sinusitis 03/30/2011  . Dermatitis 03/30/2011  . Fatigue 05/03/2011  . Ascending aorta dilatation 09/02/2011  . SOB (shortness of breath) 09/04/2011  . Tremor 12/23/2011  . Anemia 12/23/2011  . Bruising 07/30/2012  . PROSTATE CANCER 03/01/2010    Past Surgical History   Procedure Laterality Date  . Abdominal surgery  1960    Correction of PUD and hiatal hernia  . Tonsillectomy and adenoidectomy    . Colonoscopy w/ biopsies and polypectomy  1980's    several, benign  . Mose surgery to scalp      Ian Malkin RD, LDN Inpatient Clinical Dietitian Pager: [phone] After Hours Pager: [phone]

## 2012-09-06 NOTE — Progress Notes (Signed)
Respiratory protocol done. Patient was changed to BID from Q6 . Patient had BBS of clear throughout . There Wasn't a history of COPD, Asthma or any other respiratory issue found. The patient xray showed clear lungs with possibly pulmonary Emboli in the right base. CT ordered to evaluate. Will be reevaluated in 3 days per protocol.

## 2012-09-07 ENCOUNTER — Encounter (HOSPITAL_COMMUNITY): Payer: Self-pay | Admitting: *Deleted

## 2012-09-07 DIAGNOSIS — I359 Nonrheumatic aortic valve disorder, unspecified: Secondary | ICD-10-CM

## 2012-09-07 DIAGNOSIS — K219 Gastro-esophageal reflux disease without esophagitis: Secondary | ICD-10-CM

## 2012-09-07 DIAGNOSIS — G4700 Insomnia, unspecified: Secondary | ICD-10-CM

## 2012-09-07 LAB — BASIC METABOLIC PANEL
Chloride: 107 mEq/L (ref 96–112)
Creatinine, Ser: 1.47 mg/dL — ABNORMAL HIGH (ref 0.50–1.35)
GFR calc Af Amer: 49 mL/min — ABNORMAL LOW (ref >90)
Potassium: 3.3 mEq/L — ABNORMAL LOW (ref 3.5–5.1)
Sodium: 136 mEq/L (ref 135–145)

## 2012-09-07 LAB — CBC
MCV: 91.5 fL (ref 78.0–100.0)
Platelets: 257 10*3/uL (ref 150–400)
RBC: 3.42 MIL/uL — ABNORMAL LOW (ref 4.22–5.81)
RDW: 13.7 % (ref 11.5–15.5)
WBC: 18.6 10*3/uL — ABNORMAL HIGH (ref 4.0–10.5)

## 2012-09-07 MED ORDER — BACLOFEN 5 MG HALF TABLET
5.0000 mg | ORAL_TABLET | Freq: Three times a day (TID) | ORAL | Status: DC | PRN
  Administered 2012-09-07: 5 mg via ORAL
  Filled 2012-09-07: qty 1

## 2012-09-07 MED ORDER — CALCIUM CARBONATE ANTACID 500 MG PO CHEW
3.0000 | CHEWABLE_TABLET | Freq: Three times a day (TID) | ORAL | Status: DC | PRN
  Administered 2012-09-07 (×2): 600 mg via ORAL
  Filled 2012-09-07 (×2): qty 3

## 2012-09-07 MED ORDER — PROMETHAZINE HCL 25 MG/ML IJ SOLN
12.5000 mg | Freq: Once | INTRAMUSCULAR | Status: AC
  Administered 2012-09-07: 12.5 mg via INTRAVENOUS
  Filled 2012-09-07: qty 1

## 2012-09-07 MED ORDER — ALPRAZOLAM 0.5 MG PO TABS
0.5000 mg | ORAL_TABLET | Freq: Every evening | ORAL | Status: DC | PRN
  Filled 2012-09-07: qty 1

## 2012-09-07 MED ORDER — ONDANSETRON HCL 4 MG/2ML IJ SOLN
4.0000 mg | Freq: Four times a day (QID) | INTRAMUSCULAR | Status: DC | PRN
  Administered 2012-09-07: 4 mg via INTRAVENOUS
  Filled 2012-09-07: qty 2

## 2012-09-07 MED ORDER — PANTOPRAZOLE SODIUM 40 MG PO TBEC
40.0000 mg | DELAYED_RELEASE_TABLET | Freq: Two times a day (BID) | ORAL | Status: DC
  Administered 2012-09-07 – 2012-09-12 (×9): 40 mg via ORAL
  Filled 2012-09-07 (×11): qty 1

## 2012-09-07 NOTE — Progress Notes (Addendum)
[facility] PROGRESS NOTE  DARRALL STREY ZOX:[id number] DOB: 02/25/1926 DOA: 09/05/2012 PCP: Danise Edge, MD  Assessment/Plan: 1-CP/SOB: secondary to PNA and PE -continue lovenox  -follow 2-D echo -continue zithromax and rocephin -continue PRN oxygen supplementation and nebulizer tx -strep pneumonia antigen in urine and also legionella antigen negative. -no fever -EKG and cardiac enzymes negative for ACS  2-PE:continue lovenox. With plans to transition to xarelto when medically stable.  3-HTN: currently soft, but stable. Will continue holding lopressor  4-BPH: continue flomax  5-HLD: continue statins  6-GERD: continue PPI  7-Acute on chronic renal failure (stage 3 with Cr baseline of 1.3-1.4): secondary to dehydration most likely and decrease perfusion with low BP. -Cr. Pretty much back to baseline after IVF's -will continue holding antihypertensive agents -UA w/o signs of UTI -BMET in am  8-Back pain and wekaness: per PT/OT recommendations patient will benefit of HH at discharge   9-GERD: started on PPI BID  10-Insomnia: will use PRN xanax (low dose)  Code Status: Full Family Communication: no family at bedside Disposition Plan: transfer to telemetry   Consultants:  none  Procedures:  See below for x-ray reports  Antibiotics:  Rocephin 8/13  zithromax 8/13  HPI/Subjective: Afebrile, no SOB; right side back/chest discomfort with deep breath.  Objective: Filed Vitals:   09/07/12 1300  BP: 115/68  Pulse: 101  Temp: 97.9 F (36.6 C)  Resp: 16    Intake/Output Summary (Last 24 hours) at 09/07/12 1516 Last data filed at 09/07/12 1500  Gross per 24 hour  Intake    710 ml  Output    785 ml  Net    -75 ml   Filed Weights   09/05/12 1452 09/05/12 1920 09/06/12 1330  Weight: 65.772 kg (145 lb) 62.052 kg (136 lb 12.8 oz) 64.229 kg (141 lb 9.6 oz)    Exam:   General:  No fever, pain slightly improved; good O2 sat on  RA  Cardiovascular: S1 and S2, no rubs or gallops  Respiratory: CTA bilaterally  Abdomen: soft, NT, ND, positive BS  Musculoskeletal: pain with movements and deep inspiration on his back and right side  Neuro:non focal, AAOX3  Data Reviewed: Basic Metabolic Panel:  Recent Labs Lab 09/05/12 1607 09/06/12 0215 09/07/12 0453  NA 139 134* 136  K 4.2 4.0 3.3*  CL 109 108 107  CO2 18* 19 19  GLUCOSE 126* 132* 106*  BUN 24* 24* 24*  CREATININE 1.55* 1.44* 1.47*  CALCIUM 9.4 8.9 8.7   Liver Function Tests:  Recent Labs Lab 09/05/12 1607 09/06/12 0215  AST 8 6  ALT <5 <5  ALKPHOS 74 66  BILITOT 0.5 0.3  PROT 6.9 5.9*  ALBUMIN 2.9* 2.3*   CBC:  Recent Labs Lab 09/05/12 1607 09/05/12 1905 09/06/12 0215 09/07/12 0453  WBC 19.0* 18.6* 18.4* 18.6*  NEUTROABS 15.9*  --   --   --   HGB 12.9* 12.4* 11.7* 10.6*  HCT 37.9* 36.2* 34.4* 31.3*  MCV 91.8 91.6 91.0 91.5  PLT 248 247 226 257   Cardiac Enzymes:  Recent Labs Lab 09/05/12 1945 09/06/12 0215 09/06/12 0900  TROPONINI <0.30 <0.30 <0.30   BNP (last 3 results)  Recent Labs  09/05/12 1607  PROBNP 829.6*    Recent Results (from the past 240 hour(s))  CULTURE, BLOOD (ROUTINE X 2)     Status: None   Collection Time    09/05/12  5:20 PM      Result Value Range Status   Specimen  Description BLOOD RIGHT ARM   Final   Special Requests BOTTLES DRAWN AEROBIC AND ANAEROBIC 5CC   Final   Culture  Setup Time     Final   Value: 09/05/2012 20:58     Performed at [facility]   Culture     Final   Value:        BLOOD CULTURE RECEIVED NO GROWTH TO DATE CULTURE WILL BE HELD FOR 5 DAYS BEFORE ISSUING A FINAL NEGATIVE REPORT     Performed at [facility]   Report Status PENDING   Incomplete  CULTURE, BLOOD (ROUTINE X 2)     Status: None   Collection Time    09/05/12  5:35 PM      Result Value Range Status   Specimen Description BLOOD RIGHT ARM   Final   Special Requests BOTTLES DRAWN AEROBIC  AND ANAEROBIC 3CC   Final   Culture  Setup Time     Final   Value: 09/05/2012 20:58     Performed at [facility]   Culture     Final   Value:        BLOOD CULTURE RECEIVED NO GROWTH TO DATE CULTURE WILL BE HELD FOR 5 DAYS BEFORE ISSUING A FINAL NEGATIVE REPORT     Performed at [facility]   Report Status PENDING   Incomplete  MRSA PCR SCREENING     Status: None   Collection Time    09/05/12  8:55 PM      Result Value Range Status   MRSA by PCR NEGATIVE  NEGATIVE Final   Comment:            The GeneXpert MRSA Assay (FDA     approved for NASAL specimens     only), is one component of a     comprehensive MRSA colonization     surveillance program. It is not     intended to diagnose MRSA     infection nor to guide or     monitor treatment for     MRSA infections.     Studies: Dg Chest 2 View  09/05/2012   *RADIOLOGY REPORT*  Clinical Data: Chest pain, shortness of breath and weakness.  CHEST - 2 VIEW  Comparison: 11/28/2008.  Findings: Trachea is midline.  Heart is enlarged and accentuated by semi upright AP technique and low lung volumes.  There is right basilar airspace disease adjacent to an elevated right hemidiaphragm.  Difficult to exclude a tiny right pleural effusion. Linear scarring in the lingula.  A nodular density projects over the cardiac apex.  IMPRESSION:  1.  Right basilar airspace disease may be due to pneumonia. Difficult to exclude a tiny right pleural effusion. 2.  Small rounded density projecting over the cardiac apex, at the left lung base.  The patient is scheduled for a CT chest the same day.   Original Report Authenticated By: Leanna Battles, M.D.   Ct Angio Chest Aortic Dissect W &/or W/o  09/05/2012   *RADIOLOGY REPORT*  Clinical Data: Right chest and back pain.  CT ANGIOGRAPHY CHEST  Technique:  Multidetector CT imaging of the chest using the standard protocol during bolus administration of intravenous contrast. Multiplanar reconstructed images  including MIPs were obtained and reviewed to evaluate the vascular anatomy.  Contrast: 80mL OMNIPAQUE IOHEXOL 350 MG/ML SOLN  Comparison: 03/11/2010  Findings: The noncontrast scout shows no hyperdense crescent or mediastinal hematoma.  There is a moderate hiatal hernia. Extensive coronary calcifications.  Small right pleural effusion.  CTA shows a moderately large occlusive filling defect in the lower lobe branch of the right pulmonary artery extending into segmental branches. No additional filling defects are identified.  Adequate contrast opacification of the thoracic aorta with no evidence of dissection, aneurysm, or stenosis. There is classic 3- vessel brachiocephalic arch anatomy.  There is scattered aortic atheromatous calcifications.  There is a small right pleural effusion.  No hilar or mediastinal adenopathy.  Patchy atelectasis/consolidation in the lingula and right middle lobe. More extensive interstitial opacities and basilar airspace opacities in  the right lower lobe. Stable left renal cyst. Remainder visualized upper abdomen unremarkable.  Minimal spurring throughout the mid and lower thoracic spine.  IMPRESSION:  1.  Moderate   occlusive right lower lobe pulmonary embolus. I discussed the  test results over the telephone with Dr. Elesa Massed at the time of interpretation. 2.  Large hiatal hernia. 3.  Interstitial and basilar airspace opacities in the right lower lobe with a small right effusion. 4. Atherosclerosis, including aortic and coronary artery disease. Please note that although the presence of coronary artery calcium documents the presence of coronary artery disease, the severity of this disease and any potential stenosis cannot be assessed on this non-gated CT examination.  Assessment for potential risk factor modification, dietary therapy or pharmacologic therapy may be warranted, if clinically indicated.   Original Report Authenticated By: D. Andria Rhein, MD    Scheduled Meds: . azithromycin   500 mg Intravenous Q24H  . cefTRIAXone (ROCEPHIN)  IV  1 g Intravenous Q24H  . Clobetasol Propionate  1 application Apply externally QHS  . enoxaparin (LOVENOX) injection  60 mg Subcutaneous Q12H  . feeding supplement  237 mL Oral BID BM  . levalbuterol  0.63 mg Nebulization BID  . multivitamin with minerals  1 tablet Oral Daily  . pantoprazole  40 mg Oral BID  . simvastatin  20 mg Oral QHS  . sodium chloride  3 mL Intravenous Q12H  . tamsulosin  0.4 mg Oral Daily   Continuous Infusions: . sodium chloride 75 mL/hr at 09/06/12 1324    Time spent: >30 minutes    Balraj Brayfield  [facility] Pager [phone]. If 7PM-7AM, please contact night-coverage at [email or web address], password [facility] 09/07/2012, 3:16 PM  LOS: 2 days

## 2012-09-07 NOTE — Evaluation (Signed)
Physical Therapy Evaluation Patient Details Name: Sergio Anderson MRN: [id number] DOB: 09/26/1926 Today's Date: 09/07/2012 Time: 7829-5621 PT Time Calculation (min): 16 min  PT Assessment / Plan / Recommendation History of Present Illness  79 year old male with a history of hypertension, hyperlipidemia who presents to the emergency department with 4 days of constant sharp, right-sided chest pain and right-sided back pain that is worse with movement and palpation. He's had similar symptoms 6 months ago, but the pain had spontaneously resolved.  Clinical Impression  On eval, pt required Min guard assist for bed mobility, standing, side steps with RW. Pain and SOB with minimal activity-pt unable to tolerate ambulation. Recommend HHPT, RW and 24 hour supervision at this time.     PT Assessment  Patient needs continued PT services    Follow Up Recommendations  Home health PT;Supervision/Assistance - 24 hour    Does the patient have the potential to tolerate intense rehabilitation      Barriers to Discharge        Equipment Recommendations  Rolling walker with 5" wheels    Recommendations for Other Services OT consult   Frequency Min 3X/week    Precautions / Restrictions Precautions Precautions: Fall Restrictions Weight Bearing Restrictions: No   Pertinent Vitals/Pain R side, chest with activity-unrated      Mobility  Bed Mobility Bed Mobility: Supine to Sit;Sit to Supine Supine to Sit: 6: Modified independent (Device/Increase time);HOB elevated Sit to Supine: 6: Modified independent (Device/Increase time);HOB elevated Transfers Transfers: Sit to Stand;Stand to Sit Sit to Stand: 4: Min guard;From bed Stand to Sit: 4: Min guard;To bed Details for Transfer Assistance: VCS safety, hand placement. Pt maintains flexed posture due to pain.  Ambulation/Gait Ambulation/Gait Assistance: 4: Min guard Ambulation/Gait Assistance Details: 3-4 side steps to [facility] with RW. Maintained  flexed posture due to discomfort/pain    Exercises     PT Diagnosis: Difficulty walking;Generalized weakness;Acute pain  PT Problem List: Decreased strength;Decreased activity tolerance;Decreased mobility;Pain;Decreased knowledge of use of DME;Cardiopulmonary status limiting activity PT Treatment Interventions: DME instruction;Gait training;Functional mobility training;Therapeutic exercise;Therapeutic activities;Patient/family education     PT Goals(Current goals can be found in the care plan section) Acute Rehab PT Goals Patient Stated Goal: less pain. home. regain independence PT Goal Formulation: With patient Time For Goal Achievement: 09/21/12 Potential to Achieve Goals: Good  Visit Information  Last PT Received On: 09/07/12 Assistance Needed: +1 History of Present Illness: 79 year old male with a history of hypertension, hyperlipidemia who presents to the emergency department with 4 days of constant sharp, right-sided chest pain and right-sided back pain that is worse with movement and palpation. He's had similar symptoms 6 months ago, but the pain had spontaneously resolved.       Prior Functioning  Home Living Family/patient expects to be discharged to:: Private residence Living Arrangements: Spouse/significant other (he is caregiver; she has dementia) Type of Home: House Home Access: Stairs to enter;Ramped entrance Entrance Stairs-Number of Steps: 3 Entrance Stairs-Rails: Can reach both Home Layout: One level Home Equipment: Grab bars - tub/shower Prior Function Level of Independence: Independent Comments: mows, gardens Communication Communication: No difficulties    Copywriter, advertising Arousal/Alertness: Awake/alert Behavior During Therapy: WFL for tasks assessed/performed Overall Cognitive Status: Within Functional Limits for tasks assessed    Extremity/Trunk Assessment Upper Extremity Assessment Upper Extremity Assessment: Generalized weakness Lower  Extremity Assessment Lower Extremity Assessment: Generalized weakness Cervical / Trunk Assessment Cervical / Trunk Assessment: Kyphotic   Balance    End of Session PT - End  of Session Activity Tolerance: Patient limited by fatigue;Patient limited by pain Patient left: in bed;with call bell/phone within reach;with bed alarm set  GP     Rebeca Alert, MPT Pager: [phone]

## 2012-09-07 NOTE — Progress Notes (Signed)
Pt still with nausea and c/o hiccups. Called midlevel awaiting call back.

## 2012-09-07 NOTE — Progress Notes (Signed)
*  PRELIMINARY RESULTS* Echocardiogram 2D Echocardiogram has been performed.  Sergio Anderson 09/07/2012, 11:00 AM

## 2012-09-07 NOTE — Progress Notes (Signed)
Benefits Check:PER [facility] P#[phone] PT HAS COPAY OF 45.00 FOR XARELTO.

## 2012-09-07 NOTE — Evaluation (Addendum)
Occupational Therapy Evaluation Patient Details Name: Sergio Anderson MRN: [id number] DOB: [birth year] Today's Date: 09/07/2012 Time: 9629-5284 OT Time Calculation (min): 15 min  OT Assessment / Plan / Recommendation History of present illness 79 year old male with a history of hypertension, hyperlipidemia who presents to the emergency department with 4 days of constant sharp, right-sided chest pain and right-sided back pain that is worse with movement and palpation. He's had similar symptoms 6 months ago, but the pain had spontaneously resolved.   Clinical Impression   Pt was admitted for the above.  He is the caregiver for his wife, who has dementia.  Pt was independent with all adls/iadls prior to admission.  Pt is limited by pain and decreased activity tolerance at this time.  He will benefit from skilled OT to increase safety and independence with adls with supervison/set up level goals in acute.      OT Assessment  Patient needs continued OT Services    Follow Up Recommendations  Home health OT;Supervision/Assistance - 24 hour (depending upon progress/assist available)     Barriers to Discharge      Equipment Recommendations  None recommended by OT (has high commodes )    Recommendations for Other Services    Frequency  Min 2X/week    Precautions / Restrictions Precautions Precautions: Fall Restrictions Weight Bearing Restrictions: No   Pertinent Vitals/Pain Pain R side of body when sitting up--didn't specify level but stated it hurt a lot    ADL  Eating/Feeding: Independent Where Assessed - Eating/Feeding: Bed level Grooming: Set up Where Assessed - Grooming: Supine, head of bed up Upper Body Bathing: Set up Where Assessed - Upper Body Bathing: Supine, head of bed up Lower Body Bathing: Maximal assistance Where Assessed - Lower Body Bathing: Supine, head of bed up;Supported sit to stand Upper Body Dressing: Minimal assistance Where Assessed - Upper Body Dressing:  Supine, head of bed up Lower Body Dressing: +1 Total assistance Where Assessed - Lower Body Dressing: Supported sit to stand Toileting - Architect and Hygiene: Maximal assistance Where Assessed - Engineer, mining and Hygiene: Sit to stand from 3-in-1 or toilet Equipment Used: Rolling walker Transfers/Ambulation Related to ADLs: pt stood with flexed trunk and side stepped up to hob ADL Comments: pt with decreased activity tolerance.  He was able to sit at EOB but has increased pain on L side when sitting up.  Able to bring foot up to bed for adls, but endurance/activity tolerance is very limited at this time.      OT Diagnosis: Generalized weakness;Acute pain  OT Problem List: Decreased strength;Decreased activity tolerance;Impaired balance (sitting and/or standing);Cardiopulmonary status limiting activity;Pain OT Treatment Interventions: Self-care/ADL training;Energy conservation;Patient/family education;Balance training   OT Goals(Current goals can be found in the care plan section) Acute Rehab OT Goals Patient Stated Goal: less pain. home. regain independence OT Goal Formulation: With patient Time For Goal Achievement: 09/21/12 Potential to Achieve Goals: Good ADL Goals Pt Will Perform Grooming: with supervision;standing Pt Will Perform Upper Body Bathing: with set-up;sitting Pt Will Perform Lower Body Bathing: with supervision;sit to/from stand Pt Will Perform Upper Body Dressing: with set-up;sitting Pt Will Perform Lower Body Dressing: with supervision;sit to/from stand Pt Will Transfer to Toilet: with supervision;ambulating;bedside commode Pt Will Perform Toileting - Clothing Manipulation and hygiene: with supervision;sit to/from stand Additional ADL Goal #1: pt will verbalize 3 energy conservation techniques  Visit Information  Last OT Received On: 09/07/12 Assistance Needed: +1 PT/OT Co-Evaluation/Treatment: Yes History of Present Illness:  79 year old male  with a history of hypertension, hyperlipidemia who presents to the emergency department with 4 days of constant sharp, right-sided chest pain and right-sided back pain that is worse with movement and palpation. He's had similar symptoms 6 months ago, but the pain had spontaneously resolved.       Prior Functioning     Home Living Family/patient expects to be discharged to:: Private residence Living Arrangements: Spouse/significant other (he is caregiver; she has dementia) Type of Home: House Home Access: Stairs to enter;Ramped entrance Entrance Stairs-Number of Steps: 3 Entrance Stairs-Rails: Can reach both Home Layout: One level Home Equipment: Grab bars - tub/shower Prior Function Level of Independence: Independent Comments: mows, gardens Communication Communication: No difficulties         Vision/Perception     Copywriter, advertising Arousal/Alertness: Awake/alert Behavior During Therapy: WFL for tasks assessed/performed Overall Cognitive Status: Within Functional Limits for tasks assessed    Extremity/Trunk Assessment Upper Extremity Assessment Upper Extremity Assessment: Generalized weakness Lower Extremity Assessment Lower Extremity Assessment: Generalized weakness Cervical / Trunk Assessment Cervical / Trunk Assessment: Kyphotic     Mobility Bed Mobility Bed Mobility: Supine to Sit;Sit to Supine Supine to Sit: 6: Modified independent (Device/Increase time);HOB elevated Sit to Supine: 6: Modified independent (Device/Increase time);HOB elevated Transfers Sit to Stand: 4: Min guard;From bed Stand to Sit: 4: Min guard;To bed Details for Transfer Assistance: VCS safety, hand placement. Pt maintains flexed posture due to pain.      Exercise     Balance     End of Session OT - End of Session Activity Tolerance: Patient limited by fatigue;Patient limited by pain Patient left: in bed;with call bell/phone within reach  GO      [facility] 09/07/2012, 2:52 PM Marica Otter, OTR/L [phone] 09/07/2012

## 2012-09-08 LAB — BASIC METABOLIC PANEL
CO2: 18 mEq/L — ABNORMAL LOW (ref 19–32)
Calcium: 8.7 mg/dL (ref 8.4–10.5)
Potassium: 3.3 mEq/L — ABNORMAL LOW (ref 3.5–5.1)
Sodium: 136 mEq/L (ref 135–145)

## 2012-09-08 MED ORDER — WARFARIN - PHARMACIST DOSING INPATIENT: Freq: Every day | Status: DC

## 2012-09-08 MED ORDER — WARFARIN SODIUM 6 MG PO TABS
6.0000 mg | ORAL_TABLET | Freq: Once | ORAL | Status: AC
  Administered 2012-09-08: 6 mg via ORAL
  Filled 2012-09-08: qty 1

## 2012-09-08 MED ORDER — WARFARIN VIDEO
Freq: Once | Status: AC
  Administered 2012-09-08: 18:00:00

## 2012-09-08 MED ORDER — ENOXAPARIN SODIUM 60 MG/0.6ML ~~LOC~~ SOLN
60.0000 mg | SUBCUTANEOUS | Status: DC
  Administered 2012-09-08 – 2012-09-10 (×3): 60 mg via SUBCUTANEOUS
  Filled 2012-09-08 (×5): qty 0.6

## 2012-09-08 MED ORDER — PATIENT'S GUIDE TO USING COUMADIN BOOK
Freq: Once | Status: AC
  Administered 2012-09-08: 15:00:00
  Filled 2012-09-08: qty 1

## 2012-09-08 MED ORDER — AMOXICILLIN-POT CLAVULANATE 500-125 MG PO TABS
1.0000 | ORAL_TABLET | Freq: Two times a day (BID) | ORAL | Status: DC
  Administered 2012-09-09 – 2012-09-12 (×7): 500 mg via ORAL
  Filled 2012-09-08 (×8): qty 1

## 2012-09-08 NOTE — Progress Notes (Addendum)
ANTICOAGULATION CONSULT NOTE - Follow-up  Pharmacy Consult for enoxaparin/warfarin Indication: pulmonary embolus  Allergies  Allergen Reactions  . Vioxx [Rofecoxib]     Patient Measurements: Height: 5\' 3"  (160 cm) Weight: 141 lb 9.6 oz (64.229 kg) IBW/kg (Calculated) : 56.9 Heparin Dosing Weight:   Vital Signs: Temp: 97.2 F (36.2 C) (08/16 0657) Temp src: Oral (08/16 0657) BP: 120/52 mmHg (08/16 0657) Pulse Rate: 78 (08/16 0657)  Labs:  Recent Labs  09/05/12 1905 09/05/12 1945 09/06/12 0215 09/06/12 0900 09/07/12 0453 09/08/12 0506  HGB 12.4*  --  11.7*  --  10.6*  --   HCT 36.2*  --  34.4*  --  31.3*  --   PLT 247  --  226  --  257  --   APTT 42*  --   --   --   --   --   LABPROT 16.5*  --   --   --   --   --   INR 1.37  --   --   --   --   --   CREATININE  --   --  1.44*  --  1.47* 1.60*  TROPONINI  --  <0.30 <0.30 <0.30  --   --     Estimated Creatinine Clearance: 27.7 ml/min (by C-G formula based on Cr of 1.6).   Medical History: Past Medical History  Diagnosis Date  . SKIN CANCER, HX OF 03/01/2010  . PUD, HX OF 03/01/2010  . PROSTATITIS, ACUTE 04/01/2010  . Nocturia 03/01/2010  . HYPERLIPIDEMIA 03/01/2010  . ELEVATED BP READING WITHOUT DX HYPERTENSION 03/01/2010  . COLONIC POLYPS, HX OF 03/01/2010  . CATARACT EXTRACTIONS, BILATERAL, HX OF 03/01/2010  . BACK PAIN, LUMBAR, WITH RADICULOPATHY 04/01/2010  . Pulmonary nodule 09/14/2010  . HTN (hypertension) 10/05/2010  . Sinusitis 03/30/2011  . Dermatitis 03/30/2011  . Fatigue 05/03/2011  . Ascending aorta dilatation 09/02/2011  . SOB (shortness of breath) 09/04/2011  . Tremor 12/23/2011  . Anemia 12/23/2011  . Bruising 07/30/2012  . PROSTATE CANCER 03/01/2010    Assessment: Sergio Anderson presents with chest pain and shortness of breath. CT angio reveals RLL PE. Orders to start lovenox   Renal: SCr = 1.6 for est CrCl = 71ml/min, CRI evident per previous labs (prev values = 1.61-1.9)  Documented wt = 64.2kg  CBC 8/14: Hgb=10.6,  plts = 257  Baseline INR = 1.37  Progress notes state that converting to Xarelto when stable is plan  Goal of Therapy:  Anti-Xa level 0.6-1.2 units/ml 4hrs after LMWH dose given Monitor platelets by anticoagulation protocol: Yes   Plan:   Based on current renal function, Change lovenox 60mg  SQ q24h  Continue to monitor CrCl and adjust dose as indicated  Check CBC at minimum q72h while hospitalized  Await plans for oral anticoagulation  CrCl should be >38ml/min to start xarelto (current CrCl = 62ml/min, using Cockgroft-Gault  Equation using actual body weight as it was studied).  Juliette Alcide, PharmD, BCPS.   Pager: [phone]  09/08/2012,7:23 AM  ADDENDUM:  Spoke with Dr. Gwenlyn Perking re: oral anticoagulation options, due to CRI and fluctuating CrCl, warfarin likely best option, although compliance with follow-up INR monitoring is a concern  Plan: Today will be D#1 of minimum 5 day overlap for acute VTE treatment  Start with warfarin 6mg  PO tonight  Watch INR trend and for bleeding  Daily INR  Coumadin education materials ordered  Juliette Alcide, PharmD, BCPS.   Pager: [phone] 09/08/2012 11:08 AM

## 2012-09-08 NOTE — Progress Notes (Signed)
[facility] PROGRESS NOTE  Sergio Anderson ZOX:[id number] DOB: 1926-02-08 DOA: 09/05/2012 PCP: Danise Edge, MD  Assessment/Plan: 1-CP/SOB: secondary to PNA and PE -continue lovenox and start coumadin transition -2-d echo with normal right side heart -continue zithromax and rocephin X1 more day and then transition to augmentin -continue PRN oxygen supplementation and nebulizer tx -strep pneumonia antigen in urine and also legionella antigen negative. -afebrile. -EKG and cardiac enzymes negative for ACS  2-PE:continue lovenox. With plans to transition to coumadin. Patient renal function is to close to contraindication for xarelto use.  3-HTN: currently soft, but stable. Will continue holding antihypertensive agents.  4-BPH: continue flomax  5-HLD: continue statins  6-GERD: continue PPI  7-Acute on chronic renal failure (stage 3 with Cr baseline of 1.3-1.4): secondary to dehydration most likely and decrease perfusion with low BP. -Cr. Pretty much back to baseline after IVF's base on his GFR, but Cr levels fluctuates between 1.4--1.6 -will continue holding antihypertensive agents for now -UA w/o signs of UTI -BMET in am  8-Back pain and wekaness: per PT/OT recommendations patient will benefit of HH at discharge; will arranged services.  9-GERD: started on PPI BID  10-Insomnia: will continue PRN xanax (low dose)  11-Diastolic heart failure: grade 1; compensated. Continue strict I's and O's.  Code Status: Full Family Communication: no family at bedside Disposition Plan: transfer to telemetry   Consultants:  none  Procedures:  See below for x-ray reports  2-D echo: no wall motion abnormalities, EF preserved; no significant valvular defects and grade 1 diastolic dysfunction  Antibiotics:  Rocephin 8/13  zithromax 8/13  HPI/Subjective: Afebrile, no SOB; right side back/chest discomfort with deep breath improving. 2-D echo with normal right  ventricle.  Objective: Filed Vitals:   09/08/12 0657  BP: 120/52  Pulse: 78  Temp: 97.2 F (36.2 C)  Resp: 18    Intake/Output Summary (Last 24 hours) at 09/08/12 1112 Last data filed at 09/08/12 8119  Gross per 24 hour  Intake   3615 ml  Output   1420 ml  Net   2195 ml   Filed Weights   09/05/12 1452 09/05/12 1920 09/06/12 1330  Weight: 65.772 kg (145 lb) 62.052 kg (136 lb 12.8 oz) 64.229 kg (141 lb 9.6 oz)    Exam:   General:  No fever, pain on right side and back improved; good O2 sat on RA  Cardiovascular: S1 and S2, no rubs or gallops  Respiratory: CTA bilaterally  Abdomen: soft, NT, ND, positive BS  Musculoskeletal: pain with movements and deep inspiration on his back and right side  Neuro:non focal, AAOX3  Data Reviewed: Basic Metabolic Panel:  Recent Labs Lab 09/05/12 1607 09/06/12 0215 09/07/12 0453 09/08/12 0506  NA 139 134* 136 136  K 4.2 4.0 3.3* 3.3*  CL 109 108 107 109  CO2 18* 19 19 18*  GLUCOSE 126* 132* 106* 100*  BUN 24* 24* 24* 19  CREATININE 1.55* 1.44* 1.47* 1.60*  CALCIUM 9.4 8.9 8.7 8.7   Liver Function Tests:  Recent Labs Lab 09/05/12 1607 09/06/12 0215  AST 8 6  ALT <5 <5  ALKPHOS 74 66  BILITOT 0.5 0.3  PROT 6.9 5.9*  ALBUMIN 2.9* 2.3*   CBC:  Recent Labs Lab 09/05/12 1607 09/05/12 1905 09/06/12 0215 09/07/12 0453  WBC 19.0* 18.6* 18.4* 18.6*  NEUTROABS 15.9*  --   --   --   HGB 12.9* 12.4* 11.7* 10.6*  HCT 37.9* 36.2* 34.4* 31.3*  MCV 91.8 91.6 91.0 91.5  PLT 248 247 226 257   Cardiac Enzymes:  Recent Labs Lab 09/05/12 1945 09/06/12 0215 09/06/12 0900  TROPONINI <0.30 <0.30 <0.30   BNP (last 3 results)  Recent Labs  09/05/12 1607  PROBNP 829.6*    Recent Results (from the past 240 hour(s))  CULTURE, BLOOD (ROUTINE X 2)     Status: None   Collection Time    09/05/12  5:20 PM      Result Value Range Status   Specimen Description BLOOD RIGHT ARM   Final   Special Requests BOTTLES DRAWN  AEROBIC AND ANAEROBIC 5CC   Final   Culture  Setup Time     Final   Value: 09/05/2012 20:58     Performed at [facility]   Culture     Final   Value:        BLOOD CULTURE RECEIVED NO GROWTH TO DATE CULTURE WILL BE HELD FOR 5 DAYS BEFORE ISSUING A FINAL NEGATIVE REPORT     Performed at [facility]   Report Status PENDING   Incomplete  CULTURE, BLOOD (ROUTINE X 2)     Status: None   Collection Time    09/05/12  5:35 PM      Result Value Range Status   Specimen Description BLOOD RIGHT ARM   Final   Special Requests BOTTLES DRAWN AEROBIC AND ANAEROBIC 3CC   Final   Culture  Setup Time     Final   Value: 09/05/2012 20:58     Performed at [facility]   Culture     Final   Value:        BLOOD CULTURE RECEIVED NO GROWTH TO DATE CULTURE WILL BE HELD FOR 5 DAYS BEFORE ISSUING A FINAL NEGATIVE REPORT     Performed at [facility]   Report Status PENDING   Incomplete  MRSA PCR SCREENING     Status: None   Collection Time    09/05/12  8:55 PM      Result Value Range Status   MRSA by PCR NEGATIVE  NEGATIVE Final   Comment:            The GeneXpert MRSA Assay (FDA     approved for NASAL specimens     only), is one component of a     comprehensive MRSA colonization     surveillance program. It is not     intended to diagnose MRSA     infection nor to guide or     monitor treatment for     MRSA infections.     Studies: No results found.  Scheduled Meds: . [START ON 09/09/2012] amoxicillin-clavulanate  1 tablet Oral BID  . azithromycin  500 mg Intravenous Q24H  . cefTRIAXone (ROCEPHIN)  IV  1 g Intravenous Q24H  . Clobetasol Propionate  1 application Apply externally QHS  . enoxaparin (LOVENOX) injection  60 mg Subcutaneous Q24H  . feeding supplement  237 mL Oral BID BM  . levalbuterol  0.63 mg Nebulization BID  . multivitamin with minerals  1 tablet Oral Daily  . pantoprazole  40 mg Oral BID  . simvastatin  20 mg Oral QHS  . sodium chloride  3 mL  Intravenous Q12H  . tamsulosin  0.4 mg Oral Daily   Continuous Infusions: . sodium chloride 50 mL/hr (09/08/12 1109)    Time spent: >30 minutes    Alixandrea Milleson  [facility] Pager [phone]. If 7PM-7AM, please contact night-coverage at [email or web address], password [facility]  09/08/2012, 11:12 AM  LOS: 3 days

## 2012-09-09 LAB — CBC
HCT: 29.7 % — ABNORMAL LOW (ref 39.0–52.0)
Hemoglobin: 10 g/dL — ABNORMAL LOW (ref 13.0–17.0)
MCV: 91.7 fL (ref 78.0–100.0)
RBC: 3.24 MIL/uL — ABNORMAL LOW (ref 4.22–5.81)
RDW: 13.9 % (ref 11.5–15.5)
WBC: 12 10*3/uL — ABNORMAL HIGH (ref 4.0–10.5)

## 2012-09-09 LAB — BASIC METABOLIC PANEL
BUN: 17 mg/dL (ref 6–23)
CO2: 17 mEq/L — ABNORMAL LOW (ref 19–32)
Chloride: 106 mEq/L (ref 96–112)
Creatinine, Ser: 1.55 mg/dL — ABNORMAL HIGH (ref 0.50–1.35)
Glucose, Bld: 103 mg/dL — ABNORMAL HIGH (ref 70–99)
Potassium: 3.4 mEq/L — ABNORMAL LOW (ref 3.5–5.1)

## 2012-09-09 MED ORDER — DOCUSATE SODIUM 100 MG PO CAPS
100.0000 mg | ORAL_CAPSULE | Freq: Two times a day (BID) | ORAL | Status: DC
  Administered 2012-09-09 – 2012-09-11 (×4): 100 mg via ORAL
  Filled 2012-09-09 (×7): qty 1

## 2012-09-09 MED ORDER — WARFARIN SODIUM 6 MG PO TABS
6.0000 mg | ORAL_TABLET | Freq: Once | ORAL | Status: DC
  Filled 2012-09-09: qty 1

## 2012-09-09 MED ORDER — WARFARIN SODIUM 6 MG PO TABS
6.0000 mg | ORAL_TABLET | Freq: Once | ORAL | Status: AC
  Administered 2012-09-09: 6 mg via ORAL
  Filled 2012-09-09: qty 1

## 2012-09-09 MED ORDER — POLYETHYLENE GLYCOL 3350 17 G PO PACK
17.0000 g | PACK | Freq: Every day | ORAL | Status: DC
  Administered 2012-09-09 – 2012-09-11 (×3): 17 g via ORAL
  Filled 2012-09-09 (×4): qty 1

## 2012-09-09 NOTE — Progress Notes (Signed)
[facility] PROGRESS NOTE  Sergio Anderson AVW:[id number] DOB: [birth year] DOA: 09/05/2012 PCP: Danise Edge, MD  Assessment/Plan: 1-CP/SOB: secondary to PNA and PE -continue lovenox and start coumadin transition -2-d echo with normal right side heart -will continue tx with augmentin to complete antibiotic therapy -continue PRN oxygen supplementation and nebulizer tx -strep pneumonia antigen in urine and also legionella antigen negative. -afebrile; WBC's trending down -EKG and cardiac enzymes negative for ACS  2-PE:continue lovenox and coumadin per pharmacy.   3-HTN: currently soft, but stable. Will continue holding antihypertensive agents.  4-BPH: continue flomax  5-HLD: continue statins  6-GERD: continue PPI  7-Acute on chronic renal failure (stage 3 with Cr baseline of 1.3-1.4): secondary to dehydration most likely and decrease perfusion with low BP. -Cr. Pretty much back to baseline after IVF's base on his GFR,  Cr today 1.5 -will continue holding antihypertensive agents for now -UA w/o signs of UTI -will monitor  8-Back pain and wekaness: per PT/OT recommendations patient will benefit of HH at discharge; will arranged services.  9-GERD: started on PPI BID  10-Insomnia: will continue PRN xanax (low dose)  11-Diastolic heart failure: grade 1; compensated. Continue strict I's and O's.  12-constipation: will start patient on colace and miralax  Code Status: Full Family Communication: no family at bedside Disposition Plan: transfer to telemetry   Consultants:  none  Procedures:  See below for x-ray reports  2-D echo: no wall motion abnormalities, EF preserved; no significant valvular defects and grade 1 diastolic dysfunction  Antibiotics:  Rocephin 8/13  zithromax 8/13  HPI/Subjective: Afebrile, no SOB; right side back/chest discomfort continue improving. 2-D echo with normal right ventricle.  Objective: Filed Vitals:   09/09/12 1300  BP:  114/62  Pulse: 95  Temp: 98.2 F (36.8 C)  Resp: 16    Intake/Output Summary (Last 24 hours) at 09/09/12 1504 Last data filed at 09/09/12 1448  Gross per 24 hour  Intake 1479.17 ml  Output   1535 ml  Net -55.83 ml   Filed Weights   09/05/12 1920 09/06/12 1330 09/09/12 0646  Weight: 62.052 kg (136 lb 12.8 oz) 64.229 kg (141 lb 9.6 oz) 64.4 kg (141 lb 15.6 oz)    Exam:   General:  No fever, pain on right side and back continue improving; good O2 sat on RA  Cardiovascular: S1 and S2, no rubs or gallops  Respiratory: CTA bilaterally  Abdomen: soft, NT, ND, positive BS  Musculoskeletal: pain with movements and deep inspiration on his back and right side  Neuro:non focal, AAOX3  Data Reviewed: Basic Metabolic Panel:  Recent Labs Lab 09/05/12 1607 09/06/12 0215 09/07/12 0453 09/08/12 0506 09/09/12 0505  NA 139 134* 136 136 135  K 4.2 4.0 3.3* 3.3* 3.4*  CL 109 108 107 109 106  CO2 18* 19 19 18* 17*  GLUCOSE 126* 132* 106* 100* 103*  BUN 24* 24* 24* 19 17  CREATININE 1.55* 1.44* 1.47* 1.60* 1.55*  CALCIUM 9.4 8.9 8.7 8.7 8.9   Liver Function Tests:  Recent Labs Lab 09/05/12 1607 09/06/12 0215  AST 8 6  ALT <5 <5  ALKPHOS 74 66  BILITOT 0.5 0.3  PROT 6.9 5.9*  ALBUMIN 2.9* 2.3*   CBC:  Recent Labs Lab 09/05/12 1607 09/05/12 1905 09/06/12 0215 09/07/12 0453 09/09/12 0505  WBC 19.0* 18.6* 18.4* 18.6* 12.0*  NEUTROABS 15.9*  --   --   --   --   HGB 12.9* 12.4* 11.7* 10.6* 10.0*  HCT 37.9* 36.2*  34.4* 31.3* 29.7*  MCV 91.8 91.6 91.0 91.5 91.7  PLT 248 247 226 257 302   Cardiac Enzymes:  Recent Labs Lab 09/05/12 1945 09/06/12 0215 09/06/12 0900  TROPONINI <0.30 <0.30 <0.30   BNP (last 3 results)  Recent Labs  09/05/12 1607  PROBNP 829.6*    Recent Results (from the past 240 hour(s))  CULTURE, BLOOD (ROUTINE X 2)     Status: None   Collection Time    09/05/12  5:20 PM      Result Value Range Status   Specimen Description BLOOD  RIGHT ARM   Final   Special Requests BOTTLES DRAWN AEROBIC AND ANAEROBIC 5CC   Final   Culture  Setup Time     Final   Value: 09/05/2012 20:58     Performed at [facility]   Culture     Final   Value:        BLOOD CULTURE RECEIVED NO GROWTH TO DATE CULTURE WILL BE HELD FOR 5 DAYS BEFORE ISSUING A FINAL NEGATIVE REPORT     Performed at [facility]   Report Status PENDING   Incomplete  CULTURE, BLOOD (ROUTINE X 2)     Status: None   Collection Time    09/05/12  5:35 PM      Result Value Range Status   Specimen Description BLOOD RIGHT ARM   Final   Special Requests BOTTLES DRAWN AEROBIC AND ANAEROBIC 3CC   Final   Culture  Setup Time     Final   Value: 09/05/2012 20:58     Performed at [facility]   Culture     Final   Value:        BLOOD CULTURE RECEIVED NO GROWTH TO DATE CULTURE WILL BE HELD FOR 5 DAYS BEFORE ISSUING A FINAL NEGATIVE REPORT     Performed at [facility]   Report Status PENDING   Incomplete  MRSA PCR SCREENING     Status: None   Collection Time    09/05/12  8:55 PM      Result Value Range Status   MRSA by PCR NEGATIVE  NEGATIVE Final   Comment:            The GeneXpert MRSA Assay (FDA     approved for NASAL specimens     only), is one component of a     comprehensive MRSA colonization     surveillance program. It is not     intended to diagnose MRSA     infection nor to guide or     monitor treatment for     MRSA infections.     Studies: No results found.  Scheduled Meds: . amoxicillin-clavulanate  1 tablet Oral BID  . Clobetasol Propionate  1 application Apply externally QHS  . docusate sodium  100 mg Oral BID  . enoxaparin (LOVENOX) injection  60 mg Subcutaneous Q24H  . feeding supplement  237 mL Oral BID BM  . levalbuterol  0.63 mg Nebulization BID  . multivitamin with minerals  1 tablet Oral Daily  . pantoprazole  40 mg Oral BID  . polyethylene glycol  17 g Oral Daily  . simvastatin  20 mg Oral QHS  . sodium  chloride  3 mL Intravenous Q12H  . tamsulosin  0.4 mg Oral Daily  . warfarin  6 mg Oral ONCE-1800  . Warfarin - Pharmacist Dosing Inpatient   Does not apply q1800   Continuous Infusions: . sodium chloride  50 mL/hr at 09/08/12 2219    Time spent: >30 minutes    Ethlyn Alto  [facility] Pager [phone]. If 7PM-7AM, please contact night-coverage at [email or web address], password [facility] 09/09/2012, 3:04 PM  LOS: 4 days

## 2012-09-09 NOTE — Progress Notes (Signed)
ANTICOAGULATION CONSULT NOTE - Follow-up  Pharmacy Consult for enoxaparin/warfarin Indication: pulmonary embolus  Allergies  Allergen Reactions  . Vioxx [Rofecoxib]     Patient Measurements: Height: 5\' 3"  (160 cm) Weight: 141 lb 15.6 oz (64.4 kg) IBW/kg (Calculated) : 56.9 Heparin Dosing Weight:   Vital Signs: Temp: 98.1 F (36.7 C) (08/17 0646) Temp src: Oral (08/17 0646) BP: 139/70 mmHg (08/17 0646) Pulse Rate: 79 (08/17 0646)  Labs:  Recent Labs  09/07/12 0453 09/08/12 0506 09/09/12 0505  HGB 10.6*  --  10.0*  HCT 31.3*  --  29.7*  PLT 257  --  302  LABPROT  --   --  16.9*  INR  --   --  1.41  CREATININE 1.47* 1.60* 1.55*    Estimated Creatinine Clearance: 28.6 ml/min (by C-G formula based on Cr of 1.55).   Medical History: Past Medical History  Diagnosis Date  . SKIN CANCER, HX OF 03/01/2010  . PUD, HX OF 03/01/2010  . PROSTATITIS, ACUTE 04/01/2010  . Nocturia 03/01/2010  . HYPERLIPIDEMIA 03/01/2010  . ELEVATED BP READING WITHOUT DX HYPERTENSION 03/01/2010  . COLONIC POLYPS, HX OF 03/01/2010  . CATARACT EXTRACTIONS, BILATERAL, HX OF 03/01/2010  . BACK PAIN, LUMBAR, WITH RADICULOPATHY 04/01/2010  . Pulmonary nodule 09/14/2010  . HTN (hypertension) 10/05/2010  . Sinusitis 03/30/2011  . Dermatitis 03/30/2011  . Fatigue 05/03/2011  . Ascending aorta dilatation 09/02/2011  . SOB (shortness of breath) 09/04/2011  . Tremor 12/23/2011  . Anemia 12/23/2011  . Bruising 07/30/2012  . PROSTATE CANCER 03/01/2010    Assessment: 35 YOM presents with chest pain and shortness of breath. CT angio reveals RLL PE. Orders to start lovenox   Renal: SCr = 1.55 for est CrCl = 74ml/min, CRI evident per previous labs (prev values = 1.61-1.9)  Documented wt = 64.2kg  CBC 8/14: Hgb=10, plts = 302  Baseline INR = 1.41  Goal of Therapy:  Anti-Xa level 0.6-1.2 units/ml 4hrs after LMWH dose given Monitor platelets by anticoagulation protocol: Yes   Plan:  Day #2 of minimum 5 day  overlap  Continue lovenox 60mg  SQ q24h for CrCl <55ml/min  Continue to monitor CrCl and adjust dose as indicated  Repeat coumadin 6mg  po tonight  Check CBC at minimum q72h while hospitalized  Daily INR  Juliette Alcide, PharmD, BCPS.   Pager: [phone]  09/09/2012,10:18 AM

## 2012-09-10 DIAGNOSIS — R911 Solitary pulmonary nodule: Secondary | ICD-10-CM

## 2012-09-10 LAB — PROTIME-INR
INR: 3.35 — ABNORMAL HIGH (ref 0.00–1.49)
Prothrombin Time: 32.7 seconds — ABNORMAL HIGH (ref 11.6–15.2)

## 2012-09-10 MED ORDER — LEVALBUTEROL HCL 0.63 MG/3ML IN NEBU: 0.6300 mg | INHALATION_SOLUTION | Freq: Four times a day (QID) | RESPIRATORY_TRACT | Status: DC | PRN

## 2012-09-10 NOTE — Progress Notes (Signed)
ANTICOAGULATION CONSULT NOTE - Follow-up  Pharmacy Consult for enoxaparin/warfarin Indication: pulmonary embolus  Allergies  Allergen Reactions  . Vioxx [Rofecoxib]     Patient Measurements: Height: 5\' 3"  (160 cm) Weight: 141 lb 15.6 oz (64.4 kg) IBW/kg (Calculated) : 56.9  Labs:  Recent Labs  09/08/12 0506 09/09/12 0505 09/10/12 0530  HGB  --  10.0*  --   HCT  --  29.7*  --   PLT  --  302  --   LABPROT  --  16.9* 32.7*  INR  --  1.41 3.35*  CREATININE 1.60* 1.55*  --    Assessment: 86 YOM presented 8/13 with c/o chest pain and SOB. PNA on CXR. CT angio revealed RLL pulmonary embolus. Lovenox started 8/13, Coumadin added 8/16.  Coumadin education completed 8/17.   Today is D#3 of Lovenox/Coumadin overlap. The CHEST guidelines recommended a minimum of 5 days overlap AND INR must be therapeutic for at least 24 hours.  INR trended up to 3.35 today s/p only 2 doses of Coumadin 6mg . Given the quick rise in INR, this does not necessarily indicate that patient is therapeutically anticoagulated. Scr improving slowly to 1.55 for CG CrCl of 29 ml/min. CRI evident per previous labs (prev values = 1.61-1.9)  Documented wt = 64.4kg  CBC 8/14: Hgb=10, plts = 302  Plans for possible discharge 8/18  Goal of Therapy:  Anti-Xa level 0.6-1.2 units/ml 4hrs after LMWH dose given INR 2 - 3  Monitor platelets by anticoagulation protocol: Yes   Plan:   Continue Lovenox 60 mg sq q24h for CrCl < 30 ml/min.    HOLD Coumadin tonight given elevated INR   Daily PT/INR  IF patient is ready for discharge today, would recommend  Hold Coumadin tonight.  PT/INR tomorrow and restart Coumadin per clinic once INR is <3  As long as CrCl < 30 ml/min, okay to continue Lovenox 60 mg sq daily.  If CrCl > 30 ml/min, please change to 60 mg sq q12h.   Please continue Coumadin/Lovenox bridge for at least 5 days AND INR therapeutic for at least 24 hours per CHEST guidelines.  Geoffry Paradise, PharmD,  BCPS Pager: [phone] 10:14 AM Pharmacy #: [phone]

## 2012-09-10 NOTE — Progress Notes (Signed)
Occupational Therapy Treatment Patient Details Name: NOCHOLAS DAMASO MRN: [id number] DOB: 10/06/1926 Today's Date: 09/10/2012 Time: 4098-1191 OT Time Calculation (min): 10 min  OT Assessment / Plan / Recommendation  History of present illness 79 year old male with a history of hypertension, hyperlipidemia who presents to the emergency department with 4 days of constant sharp, right-sided chest pain and right-sided back pain that is worse with movement and palpation. He's had similar symptoms 6 months ago, but the pain had spontaneously resolved.   OT comments  Pt very fatiqued and premedicated prior to OT session.  Pt moves well but has decreased activity tolerance.     Follow Up Recommendations  Home health OT;Supervision/Assistance - 24 hour Pt refusing snf but has couple arranged for 24/7    Barriers to Discharge       Equipment Recommendations   (to be further evaluated for 3:1; has high toilets)    Recommendations for Other Services    Frequency Min 2X/week   Progress towards OT Goals Progress towards OT goals: Progressing toward goals (slowly)  Plan      Precautions / Restrictions Precautions Precautions: Fall Restrictions Weight Bearing Restrictions: No   Pertinent Vitals/Pain Pain improved in chest--premedicated.     ADL  Grooming: Teeth care;Wash/dry face;Set up Where Assessed - Grooming: Unsupported sitting ADL Comments: pt was feeling very fatiqued--did not sleep well.  Able to tolerate unsupported sitting for grooming but did not want to try to stand today.  Educated on energy conservation; pt verbalizes understanding.  Pt up in chair and will sit longer.  Pt did not feel up to performing any adl tasks this am.  Discussed rehab but pt is adament about going home.  He has arranged a couple to stay with him and wife.    OT Diagnosis:    OT Problem List:   OT Treatment Interventions:     OT Goals(current goals can now be found in the care plan section)    Visit  Information  Last OT Received On: 09/10/12 Assistance Needed: +1 History of Present Illness: 79 year old male with a history of hypertension, hyperlipidemia who presents to the emergency department with 4 days of constant sharp, right-sided chest pain and right-sided back pain that is worse with movement and palpation. He's had similar symptoms 6 months ago, but the pain had spontaneously resolved.    Subjective Data      Prior Functioning       Cognition  Cognition Arousal/Alertness: Awake/alert Behavior During Therapy: WFL for tasks assessed/performed Overall Cognitive Status: Within Functional Limits for tasks assessed    Mobility       Exercises      Balance     End of Session OT - End of Session Activity Tolerance: Patient limited by fatigue Patient left: in chair;with call bell/phone within reach  GO     [facility] 09/10/2012, 9:10 AM Marica Otter, OTR/L [phone] 09/10/2012

## 2012-09-10 NOTE — Progress Notes (Signed)
[facility] PROGRESS NOTE  Sergio Anderson AVW:[id number] DOB: Apr 04, 1926 DOA: 09/05/2012 PCP: Danise Edge, MD  Assessment/Plan: 1-CP/SOB: secondary to PNA and PE -continue lovenox and start coumadin transition -2-d echo with normal right side heart -will continue tx with augmentin to complete antibiotic therapy -continue PRN oxygen supplementation and nebulizer tx -strep pneumonia antigen in urine and also legionella antigen negative. -afebrile; WBC's trending down -EKG and cardiac enzymes negative for ACS  2-PE: continue lovenox and coumadin per pharmacy. INR 3.35 today.  3-HTN: currently soft, but stable. Will continue holding antihypertensive agents.  4-BPH: continue flomax  5-HLD: continue statins  6-GERD: continue PPI  7-Acute on chronic renal failure (stage 3 with Cr baseline of 1.3-1.4): secondary to dehydration most likely and decrease perfusion with low BP. -Cr. Pretty much back to baseline after IVF's base on his GFR,  Cr today 1.5 -will continue holding antihypertensive agents for now -UA w/o signs of UTI -will monitor  8-Back pain and wekaness: per PT/OT recommendations patient will benefit of HH at discharge; will arranged services.  9-GERD: continue PPI BID  10-Insomnia: will continue PRN xanax (low dose)  11-Diastolic heart failure: grade 1; compensated. Continue strict I's and O's.  12-constipation: will continue patient on colace and miralax  Code Status: Full Family Communication: no family at bedside Disposition Plan: home tomorrow with [facility] services   Consultants:  none  Procedures:  See below for x-ray reports  2-D echo: no wall motion abnormalities, EF preserved; no significant valvular defects and grade 1 diastolic dysfunction  Antibiotics:  Rocephin 8/13  zithromax 8/13  HPI/Subjective: Afebrile, no SOB; right side back/chest discomfort continue improving. 2-D echo with normal right ventricle.  Objective: Filed Vitals:    09/10/12 1500  BP: 146/72  Pulse: 86  Temp: 98 F (36.7 C)  Resp: 20    Intake/Output Summary (Last 24 hours) at 09/10/12 1732 Last data filed at 09/10/12 1400  Gross per 24 hour  Intake    240 ml  Output    705 ml  Net   -465 ml   Filed Weights   09/05/12 1920 09/06/12 1330 09/09/12 0646  Weight: 62.052 kg (136 lb 12.8 oz) 64.229 kg (141 lb 9.6 oz) 64.4 kg (141 lb 15.6 oz)    Exam:   General:  No fever, pain on right side and back continue improving; good O2 sat on RA  Cardiovascular: S1 and S2, no rubs or gallops  Respiratory: CTA bilaterally  Abdomen: soft, NT, ND, positive BS  Musculoskeletal: pain with movements and deep inspiration on his back and right side  Neuro:non focal, AAOX3  Data Reviewed: Basic Metabolic Panel:  Recent Labs Lab 09/05/12 1607 09/06/12 0215 09/07/12 0453 09/08/12 0506 09/09/12 0505  NA 139 134* 136 136 135  K 4.2 4.0 3.3* 3.3* 3.4*  CL 109 108 107 109 106  CO2 18* 19 19 18* 17*  GLUCOSE 126* 132* 106* 100* 103*  BUN 24* 24* 24* 19 17  CREATININE 1.55* 1.44* 1.47* 1.60* 1.55*  CALCIUM 9.4 8.9 8.7 8.7 8.9   Liver Function Tests:  Recent Labs Lab 09/05/12 1607 09/06/12 0215  AST 8 6  ALT <5 <5  ALKPHOS 74 66  BILITOT 0.5 0.3  PROT 6.9 5.9*  ALBUMIN 2.9* 2.3*   CBC:  Recent Labs Lab 09/05/12 1607 09/05/12 1905 09/06/12 0215 09/07/12 0453 09/09/12 0505  WBC 19.0* 18.6* 18.4* 18.6* 12.0*  NEUTROABS 15.9*  --   --   --   --  HGB 12.9* 12.4* 11.7* 10.6* 10.0*  HCT 37.9* 36.2* 34.4* 31.3* 29.7*  MCV 91.8 91.6 91.0 91.5 91.7  PLT 248 247 226 257 302   Cardiac Enzymes:  Recent Labs Lab 09/05/12 1945 09/06/12 0215 09/06/12 0900  TROPONINI <0.30 <0.30 <0.30   BNP (last 3 results)  Recent Labs  09/05/12 1607  PROBNP 829.6*    Recent Results (from the past 240 hour(s))  CULTURE, BLOOD (ROUTINE X 2)     Status: None   Collection Time    09/05/12  5:20 PM      Result Value Range Status   Specimen  Description BLOOD RIGHT ARM   Final   Special Requests BOTTLES DRAWN AEROBIC AND ANAEROBIC 5CC   Final   Culture  Setup Time     Final   Value: 09/05/2012 20:58     Performed at [facility]   Culture     Final   Value:        BLOOD CULTURE RECEIVED NO GROWTH TO DATE CULTURE WILL BE HELD FOR 5 DAYS BEFORE ISSUING A FINAL NEGATIVE REPORT     Performed at [facility]   Report Status PENDING   Incomplete  CULTURE, BLOOD (ROUTINE X 2)     Status: None   Collection Time    09/05/12  5:35 PM      Result Value Range Status   Specimen Description BLOOD RIGHT ARM   Final   Special Requests BOTTLES DRAWN AEROBIC AND ANAEROBIC 3CC   Final   Culture  Setup Time     Final   Value: 09/05/2012 20:58     Performed at [facility]   Culture     Final   Value:        BLOOD CULTURE RECEIVED NO GROWTH TO DATE CULTURE WILL BE HELD FOR 5 DAYS BEFORE ISSUING A FINAL NEGATIVE REPORT     Performed at [facility]   Report Status PENDING   Incomplete  MRSA PCR SCREENING     Status: None   Collection Time    09/05/12  8:55 PM      Result Value Range Status   MRSA by PCR NEGATIVE  NEGATIVE Final   Comment:            The GeneXpert MRSA Assay (FDA     approved for NASAL specimens     only), is one component of a     comprehensive MRSA colonization     surveillance program. It is not     intended to diagnose MRSA     infection nor to guide or     monitor treatment for     MRSA infections.     Studies: No results found.  Scheduled Meds: . amoxicillin-clavulanate  1 tablet Oral BID  . Clobetasol Propionate  1 application Apply externally QHS  . docusate sodium  100 mg Oral BID  . enoxaparin (LOVENOX) injection  60 mg Subcutaneous Q24H  . feeding supplement  237 mL Oral BID BM  . multivitamin with minerals  1 tablet Oral Daily  . pantoprazole  40 mg Oral BID  . polyethylene glycol  17 g Oral Daily  . simvastatin  20 mg Oral QHS  . sodium chloride  3 mL Intravenous  Q12H  . tamsulosin  0.4 mg Oral Daily  . Warfarin - Pharmacist Dosing Inpatient   Does not apply q1800   Continuous Infusions: . sodium chloride 50 mL/hr at 09/08/12 2219  Time spent: >30 minutes    Bracen Schum  [facility] Pager [phone]. If 7PM-7AM, please contact night-coverage at [email or web address], password [facility] 09/10/2012, 5:32 PM  LOS: 5 days

## 2012-09-10 NOTE — Progress Notes (Signed)
PT Cancellation Note  Patient Details Name: KAIYU MIRABAL MRN: [id number] DOB: 05-Jan-1927   Cancelled Treatment:    Reason Eval/Treat Not Completed: Fatigue/lethargy limiting ability to participate  Pt states he is tired and does not want to wear himself out before he goes home.  His caregiver is here and states they have a wheelchair, ramp and RW at home so he does not anticipate any trouble entering home.  Pt agrees he will need HHPT   Donnetta Hail 09/10/2012, 1:26 PM

## 2012-09-10 NOTE — Progress Notes (Addendum)
Spoke with pt concerning Home Health, pt request for [facility] Pt states, "I have two people staying with me, I don't need HHPT."  Pt had no preference for Home Health Agency. Referral given to [facility]  In house rep.

## 2012-09-11 LAB — CULTURE, BLOOD (ROUTINE X 2): Culture: NO GROWTH

## 2012-09-11 LAB — PROTIME-INR
INR: 5.37 (ref 0.00–1.49)
Prothrombin Time: 46.9 seconds — ABNORMAL HIGH (ref 11.6–15.2)

## 2012-09-11 NOTE — Progress Notes (Signed)
Occupational Therapy Treatment Patient Details Name: Sergio Anderson MRN: [id number] DOB: [birth year] Today's Date: 09/11/2012 Time: 4782-9562 OT Time Calculation (min): 11 min  OT Assessment / Plan / Recommendation  History of present illness 79 year old male with a history of hypertension, hyperlipidemia who presents to the emergency department with 4 days of constant sharp, right-sided chest pain and right-sided back pain that is worse with movement and palpation. He's had similar symptoms 6 months ago, but the pain had spontaneously resolved.   OT comments  Pt continues to have low activity tolerance. Agreeable to getting up to chair and verbalizes understanding of need to get up and increase activity but pt had no interest in performing any adls.     Follow Up Recommendations  Home health OT;Supervision/Assistance - 24 hour (has hired 24/7)    Barriers to Discharge       Equipment Recommendations  3 in 1 bedside comode    Recommendations for Other Services    Frequency     Progress towards OT Goals Progress towards OT goals: Progressing toward goals (slowly)  Plan      Precautions / Restrictions Precautions Precautions: Fall Restrictions Weight Bearing Restrictions: No   Pertinent Vitals/Pain Pain when he coughs only.  Dyspnea 2/4 for transfer    ADL  Toilet Transfer: Simulated;Min guard Toilet Transfer Method: Stand pivot (bed to chair) Transfers/Ambulation Related to ADLs: pt agreeable to getting OOB; hasn't been sleeping well, per pt and has decreased activity tolerance ADL Comments: Pt did not want to participate in adl tasks.      OT Diagnosis:    OT Problem List:   OT Treatment Interventions:     OT Goals(current goals can now be found in the care plan section)    Visit Information  Last OT Received On: 09/11/12 Assistance Needed: +1 History of Present Illness: 79 year old male with a history of hypertension, hyperlipidemia who presents to the emergency  department with 4 days of constant sharp, right-sided chest pain and right-sided back pain that is worse with movement and palpation. He's had similar symptoms 6 months ago, but the pain had spontaneously resolved.    Subjective Data      Prior Functioning       Cognition  Cognition Arousal/Alertness: Awake/alert Behavior During Therapy: WFL for tasks assessed/performed Overall Cognitive Status: Within Functional Limits for tasks assessed    Mobility  Bed Mobility Supine to Sit: 6: Modified independent (Device/Increase time);HOB elevated;With rails (used HOB up to get OOB) Transfers Sit to Stand: 4: Min guard;From bed Details for Transfer Assistance: close supervision for steadying.  Pt used recliner armrests    Exercises      Balance     End of Session OT - End of Session Activity Tolerance: Patient limited by fatigue Patient left: in chair;with call bell/phone within reach  GO     Sergio Anderson 09/11/2012, 3:10 PM Marica Otter, OTR/L [phone] 09/11/2012

## 2012-09-11 NOTE — Progress Notes (Signed)
PT Note  Checked back a 2nd time for tx session. Pt states he wants to sit up a little longer. Will check back tomorrow. Thanks. Rebeca Alert, PT [phone]

## 2012-09-11 NOTE — Progress Notes (Signed)
Pt's PCP Dr. Danise Edge office called. PCP will not be back until Monday. A call back from PCP's nurse revealed that PCP will send pt's to Coumadin Clinic at [facility] on [facility].  A call to Coumadin Clinic/[place] on [address] was made. Mr. Santillana can come into clinic or [facility] can do INR at home  call results into Clinic/[phone] to [facility]. Plans are for [facility] to do INR, call results into Coumadin Clinic on Friday.

## 2012-09-11 NOTE — Progress Notes (Signed)
PT Cancellation Note  Patient Details Name: Sergio Anderson MRN: [id number] DOB: [birth year]   Cancelled Treatment:    Reason Eval/Treat Not Completed: Fatigue/lethargy limiting ability to participate-pt states too tired to work with PT right now. Will try to check back later if schedule permits. If not, will check back on tomorrow.    Rebeca Alert, MPT Pager: [phone]

## 2012-09-11 NOTE — Progress Notes (Addendum)
ANTICOAGULATION CONSULT NOTE - Follow-up  Pharmacy Consult for enoxaparin/warfarin Indication: pulmonary embolus  Allergies  Allergen Reactions  . Vioxx [Rofecoxib]     Patient Measurements: Height: 5\' 3"  (160 cm) Weight: 145 lb 8.1 oz (66 kg) IBW/kg (Calculated) : 56.9  Labs:  Recent Labs  09/09/12 0505 09/10/12 0530 09/11/12 0410  HGB 10.0*  --   --   HCT 29.7*  --   --   PLT 302  --   --   LABPROT 16.9* 32.7* 46.9*  INR 1.41 3.35* 5.37*  CREATININE 1.55*  --   --    Assessment: 86 YOM presented 8/13 with c/o chest pain and SOB. PNA on CXR. CT angio revealed RLL pulmonary embolus. Lovenox started 8/13, Coumadin added 8/16.  Coumadin education completed 8/17. Coumadin score = 4.   Patient received 2 doses of Coumadin 6mg  and INR jumped to 3.35 on 8/18. Despite no Coumadin last night, INR up further to 5.37 this morning.  Today is supposedly D#4 of Coumadin/Lovenox bridge.  No new Scr or CBC since 8/17. No bleeding noted.   Goal of Therapy:  Anti-Xa level 0.6-1.2 units/ml 4hrs after LMWH dose given INR 2 - 3  Monitor platelets by anticoagulation protocol: Yes   Plan:   Stop Lovenox given further elevated INR  No Coumadin until INR < 3   Daily PT/INR, CBC q72 hours due tomorrow  F/u discharge plans  Geoffry Paradise, PharmD, BCPS Pager: [phone] 7:31 AM Pharmacy #: [phone]

## 2012-09-11 NOTE — Progress Notes (Signed)
CRITICAL VALUE ALERT  Critical value received: INR 5.37; Date of notification:  09/11/12 Time of notification: 0516  Critical value read back:yes  Nurse who received alert: Karie Schwalbe  MD notified (1st page):  Yes Time of first page:  0517  MD notified (2nd page):  Time of second page:  Responding MD: Time MD responded:

## 2012-09-11 NOTE — Progress Notes (Signed)
[facility] PROGRESS NOTE  Sergio Anderson ZOX:[id number] DOB: [birth year] DOA: 09/05/2012 PCP: Danise Edge, MD  Brief summary 79 y/o male admitted secondary to pleuritic CP and SOB secondary to PE and CAP. Now with some issues on coumadin transition  Assessment/Plan: 1-CP/SOB: secondary to PNA and PE -continue coumadin, no elevated INR, too high and unsafe at this moment to discharge -2-D echo with normal right side heart -will continue tx with augmentin to complete antibiotic therapy (day 7/10) -continue PRN oxygen supplementation and nebulizer tx -strep pneumonia antigen in urine and also legionella antigen negative. -afebrile; WBC's trending down -EKG and cardiac enzymes negative for ACS  2-PE: continue lovenox and coumadin per pharmacy. INR 5.37 today. -lovenox stopped -INR supra-therapeutic, will follow dose recommendation per pharmacy -[facility] to follow INR at home at discharge and patient to follow with [place] coumadin clinic at discharge  3-HTN: currently soft, but stable. Will continue holding antihypertensive agents.  4-BPH: continue flomax  5-HLD: continue statins  6-GERD: continue PPI  7-Acute on chronic renal failure (stage 3 with Cr baseline of 1.3-1.4): secondary to dehydration most likely and decrease perfusion with low BP. -Cr. Pretty much back to baseline after IVF's base on his GFR,  Cr today 1.5 -will continue holding antihypertensive agents for now -UA w/o signs of UTI -will monitor  8-Back pain and wekaness: per PT/OT recommendations patient will benefit of HH at discharge; will arranged services.  9-GERD: continue PPI BID  10-Insomnia: will continue PRN xanax (low dose)  11-Diastolic heart failure: grade 1; compensated. Continue strict I's and O's.  12-Constipation: will continue patient on colace and miralax  Code Status: Full Family Communication: no family at bedside Disposition Plan: home tomorrow with [facility]  services   Consultants:  none  Procedures:  See below for x-ray reports  2-D echo: no wall motion abnormalities, EF preserved; no significant valvular defects and grade 1 diastolic dysfunction  Antibiotics:  Rocephin 8/13--8/16  zithromax 8/13--8/16  augmentin 8/17  HPI/Subjective: Afebrile, no SOB; right side back/chest discomfort continue to improve slowly. INR 5.37  Objective: Filed Vitals:   09/11/12 1458  BP: 115/77  Pulse: 98  Temp: 98.7 F (37.1 C)  Resp: 22    Intake/Output Summary (Last 24 hours) at 09/11/12 1644 Last data filed at 09/11/12 1300  Gross per 24 hour  Intake   1040 ml  Output   1125 ml  Net    -85 ml   Filed Weights   09/06/12 1330 09/09/12 0646 09/11/12 0351  Weight: 64.229 kg (141 lb 9.6 oz) 64.4 kg (141 lb 15.6 oz) 66 kg (145 lb 8.1 oz)    Exam:   General:  No fever, pain on right side and back continue improving; good O2 sat on RA  Cardiovascular: S1 and S2, no rubs or gallops  Respiratory: CTA bilaterally  Abdomen: soft, NT, ND, positive BS  Musculoskeletal: pain with movements and deep inspiration on his back and right side  Neuro:non focal, AAOX3  Data Reviewed: Basic Metabolic Panel:  Recent Labs Lab 09/05/12 1607 09/06/12 0215 09/07/12 0453 09/08/12 0506 09/09/12 0505  NA 139 134* 136 136 135  K 4.2 4.0 3.3* 3.3* 3.4*  CL 109 108 107 109 106  CO2 18* 19 19 18* 17*  GLUCOSE 126* 132* 106* 100* 103*  BUN 24* 24* 24* 19 17  CREATININE 1.55* 1.44* 1.47* 1.60* 1.55*  CALCIUM 9.4 8.9 8.7 8.7 8.9   Liver Function Tests:  Recent Labs Lab 09/05/12 1607 09/06/12 0215  AST 8 6  ALT <5 <5  ALKPHOS 74 66  BILITOT 0.5 0.3  PROT 6.9 5.9*  ALBUMIN 2.9* 2.3*   CBC:  Recent Labs Lab 09/05/12 1607 09/05/12 1905 09/06/12 0215 09/07/12 0453 09/09/12 0505  WBC 19.0* 18.6* 18.4* 18.6* 12.0*  NEUTROABS 15.9*  --   --   --   --   HGB 12.9* 12.4* 11.7* 10.6* 10.0*  HCT 37.9* 36.2* 34.4* 31.3* 29.7*  MCV  91.8 91.6 91.0 91.5 91.7  PLT 248 247 226 257 302   Cardiac Enzymes:  Recent Labs Lab 09/05/12 1945 09/06/12 0215 09/06/12 0900  TROPONINI <0.30 <0.30 <0.30   BNP (last 3 results)  Recent Labs  09/05/12 1607  PROBNP 829.6*    Recent Results (from the past 240 hour(s))  CULTURE, BLOOD (ROUTINE X 2)     Status: None   Collection Time    09/05/12  5:20 PM      Result Value Range Status   Specimen Description BLOOD RIGHT ARM   Final   Special Requests BOTTLES DRAWN AEROBIC AND ANAEROBIC 5CC   Final   Culture  Setup Time     Final   Value: 09/05/2012 20:58     Performed at [facility]   Culture     Final   Value: NO GROWTH 5 DAYS     Performed at [facility]   Report Status 09/11/2012 FINAL   Final  CULTURE, BLOOD (ROUTINE X 2)     Status: None   Collection Time    09/05/12  5:35 PM      Result Value Range Status   Specimen Description BLOOD RIGHT ARM   Final   Special Requests BOTTLES DRAWN AEROBIC AND ANAEROBIC 3CC   Final   Culture  Setup Time     Final   Value: 09/05/2012 20:58     Performed at [facility]   Culture     Final   Value: NO GROWTH 5 DAYS     Performed at [facility]   Report Status 09/11/2012 FINAL   Final  MRSA PCR SCREENING     Status: None   Collection Time    09/05/12  8:55 PM      Result Value Range Status   MRSA by PCR NEGATIVE  NEGATIVE Final   Comment:            The GeneXpert MRSA Assay (FDA     approved for NASAL specimens     only), is one component of a     comprehensive MRSA colonization     surveillance program. It is not     intended to diagnose MRSA     infection nor to guide or     monitor treatment for     MRSA infections.     Studies: No results found.  Scheduled Meds: . amoxicillin-clavulanate  1 tablet Oral BID  . Clobetasol Propionate  1 application Apply externally QHS  . docusate sodium  100 mg Oral BID  . feeding supplement  237 mL Oral BID BM  . multivitamin with minerals   1 tablet Oral Daily  . pantoprazole  40 mg Oral BID  . polyethylene glycol  17 g Oral Daily  . simvastatin  20 mg Oral QHS  . sodium chloride  3 mL Intravenous Q12H  . tamsulosin  0.4 mg Oral Daily  . Warfarin - Pharmacist Dosing Inpatient   Does not apply q1800   Continuous Infusions: .  sodium chloride 50 mL/hr at 09/11/12 1254    Time spent: >30 minutes    Mikaya Bunner  [facility] Pager [phone]. If 7PM-7AM, please contact night-coverage at [email or web address], password [facility] 09/11/2012, 4:44 PM  LOS: 6 days

## 2012-09-12 DIAGNOSIS — Z7901 Long term (current) use of anticoagulants: Secondary | ICD-10-CM

## 2012-09-12 DIAGNOSIS — I2699 Other pulmonary embolism without acute cor pulmonale: Secondary | ICD-10-CM

## 2012-09-12 LAB — CBC
MCV: 90.2 fL (ref 78.0–100.0)
Platelets: 416 10*3/uL — ABNORMAL HIGH (ref 150–400)
RBC: 3.38 MIL/uL — ABNORMAL LOW (ref 4.22–5.81)
RDW: 13.9 % (ref 11.5–15.5)
WBC: 11.2 10*3/uL — ABNORMAL HIGH (ref 4.0–10.5)

## 2012-09-12 LAB — PROTIME-INR: Prothrombin Time: 42.2 seconds — ABNORMAL HIGH (ref 11.6–15.2)

## 2012-09-12 MED ORDER — AMOXICILLIN-POT CLAVULANATE 500-125 MG PO TABS: 1.0000 | ORAL_TABLET | Freq: Two times a day (BID) | ORAL | Status: DC

## 2012-09-12 NOTE — Progress Notes (Signed)
ANTICOAGULATION CONSULT NOTE - Follow-up  Pharmacy Consult for enoxaparin/warfarin Indication: pulmonary embolus  Allergies  Allergen Reactions  . Vioxx [Rofecoxib]     Patient Measurements: Height: 5\' 3"  (160 cm) Weight: 142 lb 3.2 oz (64.501 kg) IBW/kg (Calculated) : 56.9  Labs:  Recent Labs  09/10/12 0530 09/11/12 0410 09/12/12 0350  HGB  --   --  10.2*  HCT  --   --  30.5*  PLT  --   --  416*  LABPROT 32.7* 46.9* 42.2*  INR 3.35* 5.37* 4.68*   Assessment: 86 YOM presented 8/13 with c/o chest pain and SOB. PNA on CXR. CT angio revealed RLL pulmonary embolus. Lovenox started 8/13, Coumadin added 8/16. Coumadin education completed 8/17. Coumadin score = 4.   Patient received 2 doses of Coumadin 6mg  over the weekend and INR jumped to 3.35 on 8/18 and 5.38 on 8/19 despite holding Coumadin. Lovenox stopped 8/19 given elevated INR.  Pharmacy is aware that the CHEST guidelines recommended at least 5 days of Lovenox/Coumadin bridge and INR > 2 for at least 24 hours but unable to resume Lovenox/Coumadin at this time.  INR slowly improved to 4.68 today.  MD plans for possible discharge with INR follow up by [facility] and [facility] [facility]  Goal of Therapy:  Anti-Xa level 0.6-1.2 units/ml 4hrs after LMWH dose given INR 2 - 3  Monitor platelets by anticoagulation protocol: Yes   Plan:   Continue to HOLD Lovenox and Coumadin  Would recommend INR check tomorrow and resume Coumadin at 2mg  daily when INR is </= 3 with close PT/INR follow up  Patient no longer needs Lovenox bridge unless INR becomes subtherapeutic.  Geoffry Paradise, PharmD, BCPS Pager: [phone] 7:59 AM Pharmacy #: [phone]

## 2012-09-12 NOTE — Progress Notes (Signed)
Discharge instructions explained using teach back,instructed to pick up prescription at [facility], pt verbalizes undrstanding of same. Stable for discharge.

## 2012-09-12 NOTE — Discharge Summary (Signed)
Physician Discharge Summary  Sergio Anderson WGN:[id number] DOB: [birth year] DOA: 09/05/2012  PCP: Danise Edge, MD  Admit date: 09/05/2012 Discharge date: 09/12/2012  Time spent: 45 minutes  Recommendations for Outpatient Follow-up:  1. Patient needs INR checked 09/14/2012 this will be called into Clinic/[phone] to [facility].for further evaluation of and coordination needs and dosing of Coumadin-please repeat CBC and basic metabolic panel in a week 2. Complete 1 more day Augmentin. 3. Repeat chest x-ray 6 weeks to ensure clearing 4. Outpatient followup with urologist  Discharge Diagnoses:  Principal Problem:   Acute pulmonary embolism   Discharge Condition: fair  Diet recommendation: vitamin K controlled diet  Filed Weights   09/09/12 0646 09/11/12 0351 09/12/12 0529  Weight: 64.4 kg (141 lb 15.6 oz) 66 kg (145 lb 8.1 oz) 64.501 kg (142 lb 3.2 oz)    History of present illness:  27 Caucasian male admitted 09/05/2012 with constant right-sided chest pain with movement palpitations-current, embolism started on heparin drip-found also to have community-acquired pneumonia  Hospital Course:  Assessment/Plan:  1-CP/SOB: secondary to PNA and PE  -continue coumadin, no elevated INR, too high and unsafe at this moment to discharge  -2-D echo with normal right side heart  -will continue tx with augmentin to complete antibiotic therapy (day 7/8)-needs only one more day  -strep pneumonia antigen in urine and also legionella antigen negative.  -afebrile; WBC's trending down  -EKG and cardiac enzymes negative for ACS  2-PE: continue lovenox and coumadin per pharmacy. INR 5.37 todayand it trended down on 8/20  To 4.6 -lovenox stopped  -INR supra-therapeutic-has followup scheduled at [facility]-HHRN to follow INR at home at discharge  3-HTN: currently soft, but stable. Will continue holding antihypertensive agents amlodipine 10 mg 4-BPH/prostate cancer- continue flomax will need  outpatient urology followup 5-HLD: continue statins , Zocor 20 daily 6-GERD: continue PPI  7-Acute on chronic renal failure (stage 3 with Cr baseline of 1.3-1.4): secondary to dehydration most likely and decrease perfusion with low BP.  -Cr. Pretty much back to baseline after IVF's base on his GFR. -will continue holding antihypertensive agents for now  -UA w/o signs of UTI  -will monitor  8-Back pain and wekaness: per PT/OT recommendations patient will benefit of HH at discharge 10-Insomnia: will continue PRN xanax (low dose)  11-Diastolic heart failure: grade 1; compensated. 12-Constipation: will continue patient on colace and miralax   Consultants:  none Procedures:  See below for x-ray reports  2-D echo: no wall motion abnormalities, EF preserved; no significant valvular defects and grade 1 diastolic dysfunction Antibiotics:  Rocephin 8/13--8/16  zithromax 8/13--8/16  augmentin 8/17-->8/21  Discharge Exam: Filed Vitals:   09/12/12 0529  BP: 128/67  Pulse: 84  Temp: 98.3 F (36.8 C)  Resp: 24   Alert pleasant oriented no apparent distress head/chest pain this morning   General: alert pleasant Cardiovascular: S1-S2 no murmur rub or gallop Respiratory: clinically clear  Discharge Instructions  Discharge Orders   Future Appointments Provider Department Dept Phone   10/30/2012 2:00 PM Bradd Canary, MD [facility]  [facility] [phone]   Future Orders Complete By Expires   Diet general  As directed    Comments:     Vit K controlled diet   Discharge instructions  As directed    Comments:     Complete 2 more datys Augmentin therapy  Re-check INR Friday 09/14/12 and then will need probable life-long coumadin therapy   You were cared for by a hospitalist during  your hospital stay. If you have any questions about your discharge medications or the care you received while you were in the hospital after you are discharged, you can call the unit and asked to  speak with the hospitalist on call if the hospitalist that took care of you is not available. Once you are discharged, your primary care physician will handle any further medical issues. Please note that NO REFILLS for any discharge medications will be authorized once you are discharged, as it is imperative that you return to your primary care physician (or establish a relationship with a primary care physician if you do not have one) for your aftercare needs so that they can reassess your need for medications and monitor your lab values. If you do not have a primary care physician, you can call [phone] for a physician referral.   Increase activity slowly  As directed        Medication List    STOP taking these medications       amLODipine 2.5 MG tablet  Commonly known as:  NORVASC      TAKE these medications       amoxicillin-clavulanate 500-125 MG per tablet  Commonly known as:  AUGMENTIN  Take 1 tablet (500 mg total) by mouth 2 (two) times daily.     Clobetasol Propionate 0.05 % lotion  Apply 1 application topically at bedtime. Applied to lower back.     megestrol 40 MG tablet  Commonly known as:  MEGACE  Take 40 mg by mouth 2 (two) times daily.     metoprolol tartrate 25 MG tablet  Commonly known as:  LOPRESSOR  Take 1 tablet (25 mg total) by mouth 2 (two) times daily.     simvastatin 20 MG tablet  Commonly known as:  ZOCOR  Take 1 tablet (20 mg total) by mouth at bedtime.     tamsulosin 0.4 MG Caps capsule  Commonly known as:  FLOMAX  Take 1 capsule (0.4 mg total) by mouth daily.       Allergies  Allergen Reactions  . Vioxx [Rofecoxib]       The results of significant diagnostics from this hospitalization (including imaging, microbiology, ancillary and laboratory) are listed below for reference.    Significant Diagnostic Studies: Dg Chest 2 View  09/05/2012   *RADIOLOGY REPORT*  Clinical Data: Chest pain, shortness of breath and weakness.  CHEST - 2 VIEW   Comparison: 11/28/2008.  Findings: Trachea is midline.  Heart is enlarged and accentuated by semi upright AP technique and low lung volumes.  There is right basilar airspace disease adjacent to an elevated right hemidiaphragm.  Difficult to exclude a tiny right pleural effusion. Linear scarring in the lingula.  A nodular density projects over the cardiac apex.  IMPRESSION:  1.  Right basilar airspace disease may be due to pneumonia. Difficult to exclude a tiny right pleural effusion. 2.  Small rounded density projecting over the cardiac apex, at the left lung base.  The patient is scheduled for a CT chest the same day.   Original Report Authenticated By: Leanna Battles, M.D.   Ct Angio Chest Aortic Dissect W &/or W/o  09/05/2012   *RADIOLOGY REPORT*  Clinical Data: Right chest and back pain.  CT ANGIOGRAPHY CHEST  Technique:  Multidetector CT imaging of the chest using the standard protocol during bolus administration of intravenous contrast. Multiplanar reconstructed images including MIPs were obtained and reviewed to evaluate the vascular anatomy.  Contrast: 80mL OMNIPAQUE IOHEXOL 350  MG/ML SOLN  Comparison: 03/11/2010  Findings: The noncontrast scout shows no hyperdense crescent or mediastinal hematoma.  There is a moderate hiatal hernia. Extensive coronary calcifications.  Small right pleural effusion.  CTA shows a moderately large occlusive filling defect in the lower lobe branch of the right pulmonary artery extending into segmental branches. No additional filling defects are identified.  Adequate contrast opacification of the thoracic aorta with no evidence of dissection, aneurysm, or stenosis. There is classic 3- vessel brachiocephalic arch anatomy.  There is scattered aortic atheromatous calcifications.  There is a small right pleural effusion.  No hilar or mediastinal adenopathy.  Patchy atelectasis/consolidation in the lingula and right middle lobe. More extensive interstitial opacities and basilar  airspace opacities in  the right lower lobe. Stable left renal cyst. Remainder visualized upper abdomen unremarkable.  Minimal spurring throughout the mid and lower thoracic spine.  IMPRESSION:  1.  Moderate   occlusive right lower lobe pulmonary embolus. I discussed the  test results over the telephone with Dr. Elesa Massed at the time of interpretation. 2.  Large hiatal hernia. 3.  Interstitial and basilar airspace opacities in the right lower lobe with a small right effusion. 4. Atherosclerosis, including aortic and coronary artery disease. Please note that although the presence of coronary artery calcium documents the presence of coronary artery disease, the severity of this disease and any potential stenosis cannot be assessed on this non-gated CT examination.  Assessment for potential risk factor modification, dietary therapy or pharmacologic therapy may be warranted, if clinically indicated.   Original Report Authenticated By: D. Andria Rhein, MD    Microbiology: Recent Results (from the past 240 hour(s))  CULTURE, BLOOD (ROUTINE X 2)     Status: None   Collection Time    09/05/12  5:20 PM      Result Value Range Status   Specimen Description BLOOD RIGHT ARM   Final   Special Requests BOTTLES DRAWN AEROBIC AND ANAEROBIC 5CC   Final   Culture  Setup Time     Final   Value: 09/05/2012 20:58     Performed at [facility]   Culture     Final   Value: NO GROWTH 5 DAYS     Performed at [facility]   Report Status 09/11/2012 FINAL   Final  CULTURE, BLOOD (ROUTINE X 2)     Status: None   Collection Time    09/05/12  5:35 PM      Result Value Range Status   Specimen Description BLOOD RIGHT ARM   Final   Special Requests BOTTLES DRAWN AEROBIC AND ANAEROBIC 3CC   Final   Culture  Setup Time     Final   Value: 09/05/2012 20:58     Performed at [facility]   Culture     Final   Value: NO GROWTH 5 DAYS     Performed at [facility]   Report Status 09/11/2012 FINAL    Final  MRSA PCR SCREENING     Status: None   Collection Time    09/05/12  8:55 PM      Result Value Range Status   MRSA by PCR NEGATIVE  NEGATIVE Final   Comment:            The GeneXpert MRSA Assay (FDA     approved for NASAL specimens     only), is one component of a     comprehensive MRSA colonization     surveillance program.  It is not     intended to diagnose MRSA     infection nor to guide or     monitor treatment for     MRSA infections.     Labs: Basic Metabolic Panel:  Recent Labs Lab 09/05/12 1607 09/06/12 0215 09/07/12 0453 09/08/12 0506 09/09/12 0505  NA 139 134* 136 136 135  K 4.2 4.0 3.3* 3.3* 3.4*  CL 109 108 107 109 106  CO2 18* 19 19 18* 17*  GLUCOSE 126* 132* 106* 100* 103*  BUN 24* 24* 24* 19 17  CREATININE 1.55* 1.44* 1.47* 1.60* 1.55*  CALCIUM 9.4 8.9 8.7 8.7 8.9   Liver Function Tests:  Recent Labs Lab 09/05/12 1607 09/06/12 0215  AST 8 6  ALT <5 <5  ALKPHOS 74 66  BILITOT 0.5 0.3  PROT 6.9 5.9*  ALBUMIN 2.9* 2.3*   No results found for this basename: LIPASE, AMYLASE,  in the last 168 hours No results found for this basename: AMMONIA,  in the last 168 hours CBC:  Recent Labs Lab 09/05/12 1607 09/05/12 1905 09/06/12 0215 09/07/12 0453 09/09/12 0505 09/12/12 0350  WBC 19.0* 18.6* 18.4* 18.6* 12.0* 11.2*  NEUTROABS 15.9*  --   --   --   --   --   HGB 12.9* 12.4* 11.7* 10.6* 10.0* 10.2*  HCT 37.9* 36.2* 34.4* 31.3* 29.7* 30.5*  MCV 91.8 91.6 91.0 91.5 91.7 90.2  PLT 248 247 226 257 302 416*   Cardiac Enzymes:  Recent Labs Lab 09/05/12 1945 09/06/12 0215 09/06/12 0900  TROPONINI <0.30 <0.30 <0.30   BNP: BNP (last 3 results)  Recent Labs  09/05/12 1607  PROBNP 829.6*   CBG: No results found for this basename: GLUCAP,  in the last 168 hours     Signed:  Rhetta Mura  [facility] 09/12/2012, 10:09 AM

## 2012-09-14 ENCOUNTER — Ambulatory Visit (INDEPENDENT_AMBULATORY_CARE_PROVIDER_SITE_OTHER): Payer: Medicare Other | Admitting: General Practice

## 2012-09-14 ENCOUNTER — Other Ambulatory Visit: Payer: Self-pay | Admitting: Family Medicine

## 2012-09-14 DIAGNOSIS — Z7901 Long term (current) use of anticoagulants: Secondary | ICD-10-CM

## 2012-09-14 DIAGNOSIS — I2699 Other pulmonary embolism without acute cor pulmonale: Secondary | ICD-10-CM

## 2012-09-14 LAB — POCT INR: INR: 4.7

## 2012-09-17 ENCOUNTER — Ambulatory Visit (INDEPENDENT_AMBULATORY_CARE_PROVIDER_SITE_OTHER): Payer: Self-pay | Admitting: General Practice

## 2012-09-17 ENCOUNTER — Other Ambulatory Visit: Payer: Self-pay | Admitting: General Practice

## 2012-09-17 ENCOUNTER — Telehealth: Payer: Self-pay

## 2012-09-17 DIAGNOSIS — I2699 Other pulmonary embolism without acute cor pulmonale: Secondary | ICD-10-CM

## 2012-09-17 DIAGNOSIS — Z7901 Long term (current) use of anticoagulants: Secondary | ICD-10-CM

## 2012-09-17 LAB — POCT INR: INR: 2

## 2012-09-17 MED ORDER — WARFARIN SODIUM 2.5 MG PO TABS: ORAL_TABLET | ORAL | Status: DC

## 2012-09-17 NOTE — Telephone Encounter (Signed)
Should come in for hospital follow up anyway

## 2012-09-17 NOTE — Telephone Encounter (Signed)
I left a detailed message on Britta Mccreedy w/[facility] and called to inform pt and he states he is coming in on Sept 7

## 2012-09-17 NOTE — Telephone Encounter (Signed)
Barbara with [facility] called stating that pt is complaining about a dry cough since he was in the hospital. Non productive.   Does pt need to have a visit or is there anything he can take?  Please advise?

## 2012-09-21 ENCOUNTER — Ambulatory Visit (INDEPENDENT_AMBULATORY_CARE_PROVIDER_SITE_OTHER): Payer: Medicare Other | Admitting: General Practice

## 2012-09-21 DIAGNOSIS — Z7901 Long term (current) use of anticoagulants: Secondary | ICD-10-CM

## 2012-09-21 DIAGNOSIS — I2699 Other pulmonary embolism without acute cor pulmonale: Secondary | ICD-10-CM

## 2012-09-28 ENCOUNTER — Ambulatory Visit (INDEPENDENT_AMBULATORY_CARE_PROVIDER_SITE_OTHER): Payer: Medicare Other | Admitting: General Practice

## 2012-09-28 DIAGNOSIS — Z7901 Long term (current) use of anticoagulants: Secondary | ICD-10-CM

## 2012-09-28 DIAGNOSIS — I2699 Other pulmonary embolism without acute cor pulmonale: Secondary | ICD-10-CM

## 2012-10-03 ENCOUNTER — Ambulatory Visit (INDEPENDENT_AMBULATORY_CARE_PROVIDER_SITE_OTHER): Payer: Medicare Other | Admitting: General Practice

## 2012-10-03 DIAGNOSIS — I2699 Other pulmonary embolism without acute cor pulmonale: Secondary | ICD-10-CM

## 2012-10-03 DIAGNOSIS — Z7901 Long term (current) use of anticoagulants: Secondary | ICD-10-CM

## 2012-10-03 LAB — POCT INR: INR: 1.2

## 2012-10-30 ENCOUNTER — Encounter: Payer: Self-pay | Admitting: Family Medicine

## 2012-10-30 ENCOUNTER — Ambulatory Visit (INDEPENDENT_AMBULATORY_CARE_PROVIDER_SITE_OTHER): Payer: Medicare Other | Admitting: Family Medicine

## 2012-10-30 VITALS — BP 110/62 | HR 69 | Temp 97.5°F | Ht 66.0 in | Wt 139.1 lb

## 2012-10-30 DIAGNOSIS — D649 Anemia, unspecified: Secondary | ICD-10-CM

## 2012-10-30 DIAGNOSIS — I10 Essential (primary) hypertension: Secondary | ICD-10-CM

## 2012-10-30 DIAGNOSIS — N189 Chronic kidney disease, unspecified: Secondary | ICD-10-CM

## 2012-10-30 DIAGNOSIS — E785 Hyperlipidemia, unspecified: Secondary | ICD-10-CM

## 2012-10-30 DIAGNOSIS — I2699 Other pulmonary embolism without acute cor pulmonale: Secondary | ICD-10-CM

## 2012-10-30 DIAGNOSIS — Z23 Encounter for immunization: Secondary | ICD-10-CM

## 2012-10-30 LAB — CBC
HCT: 36.9 % — ABNORMAL LOW (ref 39.0–52.0)
Hemoglobin: 12.4 g/dL — ABNORMAL LOW (ref 13.0–17.0)
MCHC: 33.6 g/dL (ref 30.0–36.0)
MCV: 88.1 fL (ref 78.0–100.0)
RDW: 15.1 % (ref 11.5–15.5)

## 2012-10-30 NOTE — Patient Instructions (Addendum)
Pulmonary Embolus A pulmonary (lung) embolus (PE) is a blood clot that has traveled from another place in the body to the lung. Most clots come from deep veins in the legs or pelvis. PE is a dangerous and potentially life-threatening condition that can be treated if identified. CAUSES Blood clots form in a vein for different reasons. Usually several things cause blood clots. They include:  The flow of blood slows down.  The inside of the vein is damaged in some way.  The person has a condition that makes the blood clot more easily. These conditions may include:  Older age (especially over 75 years old).  Having a history of blood clots.  Having major or lengthy surgery. Hip surgery is particularly high-risk.  Breaking a hip or leg.  Sitting or lying still for a long time.  Cancer or cancer treatment.  Having a long, thin tube (catheter) placed inside a vein during a medical procedure.  Being overweight (obese).  Pregnancy and childbirth.  Medicines with estrogen.  Smoking.  Other circulation or heart problems. SYMPTOMS  The symptoms of a PE usually start suddenly and include:  Shortness of breath.  Coughing.  Coughing up blood or blood-tinged mucus (phlegm).  Chest pain. Pain is often worse with deep breaths.  Rapid heartbeat. DIAGNOSIS  If a PE is suspected, your caregiver will take a medical history and carry out a physical exam. Your caregiver will check for the risk factors listed above. Tests that also may be required include:  Blood tests, including studies of the clotting properties of your blood.  Imaging tests. Ultrasound, CT, MRI, and other tests can all be used to see if you have clots in your legs or lungs. If you have a clot in your legs and have breathing or chest problems, your caregiver may conclude that you have a clot in your lungs. Further lung tests may not be needed.  Electrocardiography can look for heart strain from blood clots in the  lungs. PREVENTION   Exercise the legs regularly. Take a brisk 30 minute walk every day.  Maintain a weight that is appropriate for your height.  Avoid sitting or lying in bed for long periods of time without moving your legs.  Women, particularly those over the age of 35, should consider the risks and benefits of taking estrogen medicines, including birth control pills.  Do not smoke, especially if you take estrogen medicines.  Long-distance travel can increase your risk. You should exercise your legs by walking or pumping the muscles every hour.  In hospital prevention:  Your caregiver will assess your need for preventive PE care (prophylaxis) when you are admitted to the hospital. If you are having surgery, your surgeon will assess you the day of or day after surgery.  Prevention may include medical and nonmedical measures. TREATMENT   The most common treatment for a PE is blood thinning (anticoagulant) medicine, which reduces the blood's tendency to clot. Anticoagulants can stop new blood clots from forming and old ones from growing. They cannot dissolve existing clots. Your body does this by itself over time. Anticoagulants can be given by mouth, by intravenous (IV) access, or by injection. Your caregiver will determine the best program for you.  Less commonly, clot-dissolving drugs (thrombolytics) are used to dissolve a PE. They carry a high risk of bleeding, so they are used mainly in severe cases.  Very rarely, a blood clot in the leg needs to be removed surgically.  If you are unable to   take anticoagulants, your caregiver may arrange for you to have a filter placed in a main vein in your abdomen. This filter prevents clots from traveling to your lungs. HOME CARE INSTRUCTIONS   Take all medicines prescribed by your caregiver. Follow the directions carefully.  Warfarin. Most people will continue taking warfarin after hospital discharge. Your caregiver will advise you on the  length of treatment (usually 3 6 months, sometimes lifelong).  Too much and too little warfarin are both dangerous. Too much warfarin increases the risk of bleeding. Too little warfarin continues to allow the risk for blood clots. While taking warfarin, you will need to have regular blood tests to measure your blood clotting time. These blood tests usually include both the prothrombin time (PT) and International Normalized Ratio (INR) tests. The PT and INR results allow your caregiver to adjust your dose of warfarin. The dose can change for many reasons. It is critically important that you take warfarin exactly as prescribed, and that you have your PT and INR levels drawn exactly as directed.  Many foods, especially foods high in vitamin K can interfere with warfarin and affect the PT and INR results. Foods high in vitamin K include spinach, kale, broccoli, cabbage, collard and turnip greens, brussels sprouts, peas, cauliflower, seaweed, and parsley as well as beef and pork liver, green tea, and soybean oil. You should eat a consistent amount of foods high in vitamin K. Avoid major changes in your diet, or notify your caregiver before changing your diet. Arrange a visit with a dietitian to answer your questions.  Many medicines can interfere with warfarin and affect the PT and INR results. You must tell your caregiver about any and all medicines you take, this includes all vitamins and supplements. Be especially cautious with aspirin and anti-inflammatory medicines. Ask your caregiver before taking these. Do not take or discontinue any prescribed or over-the-counter medicine except on the advice of your caregiver or pharmacist.  Warfarin can have side effects, such as excessive bruising or bleeding. You will need to hold pressure over cuts for longer than usual.  Alcohol can change the body's ability to handle warfarin. It is best to avoid alcoholic drinks or consume only very small amounts while taking  warfarin. Notify your caregiver if you change your alcohol intake.  Notify your dentist or other caregivers before procedures.  Avoid contact sports.  Wear a medical alert bracelet or carry a medical alert card.  Ask your caregiver how soon you can go back to normal activities. Not being active can lead to new clots. Ask for a list of what you should and should not do.  Compression stockings. These are tight elastic stockings that apply pressure to the lower legs. This can help keep the blood in the legs from clotting. You may need to wear compressions stockings at home to help prevent clots.  Smoking. If you smoke, quit. Ask your caregiver for help with quitting smoking.  Learn as much as you can about PE. Educating yourself can help prevent PE from reoccurring. SEEK MEDICAL CARE IF:   You notice a rapid heartbeat.  You feel weaker or more tired than usual.  You feel faint.  You notice increased bruising.  Your symptoms are not getting better in the time expected.  You are having side effects of medicine. SEEK IMMEDIATE MEDICAL CARE IF:   You have chest pain.  You have trouble breathing.  You have new or increased swelling or pain in one leg.  You   cough up blood.  You notice blood in vomit, in a bowel movement, or in urine.  You have an oral temperature above 102 F (38.9 C), not controlled by medicine. You may have another PE. A blood clot in the lungs is a medical emergency. Call your local emergency services (911 in [place] to get to the nearest hospital or clinic. Do not drive yourself. MAKE SURE YOU:   Understand these instructions.  Will watch your condition.  Will get help right away if you are not doing well or get worse. Document Released: 01/08/2000 Document Revised: 07/12/2011 Document Reviewed: 07/14/2008 [employer] Patient Information 2014 [employer].  

## 2012-10-30 NOTE — Progress Notes (Signed)
Patient ID: Sergio Anderson, male   DOB: 1926/06/02, 79 y.o.   MRN: [id number] SHAVAR GORKA [id number] March 25, 1926 10/30/2012      Progress Note-Follow Up  Subjective  Chief Complaint  Chief Complaint  Patient presents with  . Follow-up    2 month  . Injections    flu    HPI   Patient is an 79 year old Caucasian male who presents for followup. She was recently hospitalized with a pulmonary embolism and was started on Coumadin. Since being home he said no further chest pain or shortness of breath but had no be able to get his INR checked. He denies any bloody or tarry stool. No urinary symptoms that are new. No hematuria. No fevers or chills. Is taking Coumadin 2 mg daily as directed.  Past Medical History  Diagnosis Date  . SKIN CANCER, HX OF 03/01/2010  . PUD, HX OF 03/01/2010  . PROSTATITIS, ACUTE 04/01/2010  . Nocturia 03/01/2010  . HYPERLIPIDEMIA 03/01/2010  . ELEVATED BP READING WITHOUT DX HYPERTENSION 03/01/2010  . COLONIC POLYPS, HX OF 03/01/2010  . CATARACT EXTRACTIONS, BILATERAL, HX OF 03/01/2010  . BACK PAIN, LUMBAR, WITH RADICULOPATHY 04/01/2010  . Pulmonary nodule 09/14/2010  . HTN (hypertension) 10/05/2010  . Sinusitis 03/30/2011  . Dermatitis 03/30/2011  . Fatigue 05/03/2011  . Ascending aorta dilatation 09/02/2011  . SOB (shortness of breath) 09/04/2011  . Tremor 12/23/2011  . Anemia 12/23/2011  . Bruising 07/30/2012  . PROSTATE CANCER 03/01/2010    Past Surgical History  Procedure Laterality Date  . Abdominal surgery  1960    Correction of PUD and hiatal hernia  . Tonsillectomy and adenoidectomy    . Colonoscopy w/ biopsies and polypectomy  1980's    several, benign  . Mose surgery to scalp      Family History  Problem Relation Age of Onset  . Stroke Mother   . Hip fracture Mother   . Lung disease Father     smoker  . Cancer Sister     lung/ smoker  . Heart disease Brother   . Heart attack Brother   . Alcohol abuse Maternal Grandfather   . Cancer Paternal  Grandmother     colon  . Pneumonia Brother   . Alcohol abuse Brother   . Achalasia Son     History   Social History  . Marital Status: Married    Spouse Name: N/A    Number of Children: N/A  . Years of Education: N/A   Occupational History  . Not on file.   Social History Main Topics  . Smoking status: Never Smoker   . Smokeless tobacco: Never Used  . Alcohol Use: No  . Drug Use: No  . Sexual Activity: Not on file   Other Topics Concern  . Not on file   Social History Narrative  . No narrative on file    Current Outpatient Prescriptions on File Prior to Visit  Medication Sig Dispense Refill  . Clobetasol Propionate 0.05 % lotion Apply 1 application topically at bedtime. Applied to lower back.      . metoprolol tartrate (LOPRESSOR) 25 MG tablet Take 1 tablet (25 mg total) by mouth 2 (two) times daily.  60 tablet  5  . simvastatin (ZOCOR) 20 MG tablet TAKE 1 TABLET BY MOUTH EVERY NIGHT AT BEDTIME  90 tablet  1  . Tamsulosin HCl (FLOMAX) 0.4 MG CAPS Take 1 capsule (0.4 mg total) by mouth daily.  90 capsule  3  .  warfarin (COUMADIN) 2.5 MG tablet Take as directed by anticoagulation clinic.  35 tablet  1   No current facility-administered medications on file prior to visit.    Allergies  Allergen Reactions  . Vioxx [Rofecoxib]     Review of Systems  Review of Systems  Constitutional: Positive for malaise/fatigue. Negative for fever.  HENT: Negative for congestion.   Eyes: Negative for discharge.  Respiratory: Positive for shortness of breath. Negative for wheezing.   Cardiovascular: Positive for chest pain. Negative for palpitations and leg swelling.  Gastrointestinal: Negative for nausea, abdominal pain and diarrhea.  Genitourinary: Negative for dysuria.  Musculoskeletal: Negative for falls.  Skin: Positive for itching and rash.  Neurological: Negative for loss of consciousness and headaches.  Endo/Heme/Allergies: Negative for polydipsia.   Psychiatric/Behavioral: Negative for depression and suicidal ideas. The patient is not nervous/anxious and does not have insomnia.     Objective  BP 110/62  Pulse 69  Temp(Src) 97.5 F (36.4 C) (Oral)  Ht 5\' 6"  (1.676 m)  Wt 139 lb 1.9 oz (63.104 kg)  BMI 22.47 kg/m2  SpO2 92%  Physical Exam  Physical Exam  Constitutional: He is oriented to person, place, and time and well-developed, well-nourished, and in no distress. No distress.  HENT:  Head: Normocephalic and atraumatic.  Eyes: Conjunctivae are normal.  Neck: Neck supple. No thyromegaly present.  Cardiovascular: Normal rate and regular rhythm.   Murmur heard. Pulmonary/Chest: Effort normal and breath sounds normal. No respiratory distress.  Abdominal: He exhibits no distension and no mass. There is no tenderness.  Musculoskeletal: He exhibits no edema.  Neurological: He is alert and oriented to person, place, and time.  Skin: Skin is warm and dry. There is erythema.  Psychiatric: Memory, affect and judgment normal.    Lab Results  Component Value Date   TSH 1.228 09/05/2012   Lab Results  Component Value Date   WBC 9.9 10/30/2012   HGB 12.4* 10/30/2012   HCT 36.9* 10/30/2012   MCV 88.1 10/30/2012   PLT 368 10/30/2012   Lab Results  Component Value Date   CREATININE 1.55* 09/09/2012   BUN 17 09/09/2012   NA 135 09/09/2012   K 3.4* 09/09/2012   CL 106 09/09/2012   CO2 17* 09/09/2012   Lab Results  Component Value Date   ALT <5 09/06/2012   AST 6 09/06/2012   ALKPHOS 66 09/06/2012   BILITOT 0.3 09/06/2012   Lab Results  Component Value Date   CHOL 116 12/21/2011   Lab Results  Component Value Date   HDL 25.70* 12/21/2011   Lab Results  Component Value Date   LDLCALC 58 12/21/2011   Lab Results  Component Value Date   TRIG 161.0* 12/21/2011   Lab Results  Component Value Date   CHOLHDL 5 12/21/2011     Assessment & Plan  Acute pulmonary embolism Have stopped Megace. Patient has difficulty getting  here due to the drive so had not presented to have his INR checked. It was elevated above 5 but with holding dropped to .13. Will restart at 1  Mg daily on 10/12 recheck PT/INR on 10/13. Tolerating Coumadin  HTN (hypertension) Well controlled, no changes   Anemia Mild improved.  CRF (chronic renal failure) Persistent, stable  HYPERLIPIDEMIA Tolerating Simvastatin, no changes after reviewing lipid panel

## 2012-10-31 ENCOUNTER — Telehealth: Payer: Self-pay | Admitting: *Deleted

## 2012-10-31 ENCOUNTER — Ambulatory Visit (INDEPENDENT_AMBULATORY_CARE_PROVIDER_SITE_OTHER)
Admission: RE | Admit: 2012-10-31 | Discharge: 2012-10-31 | Disposition: A | Discharge status: Home / Self Care | Payer: Medicare Other | Source: Ambulatory Visit | Attending: Internal Medicine | Admitting: Internal Medicine

## 2012-10-31 ENCOUNTER — Other Ambulatory Visit: Payer: Self-pay | Admitting: Family Medicine

## 2012-10-31 ENCOUNTER — Encounter: Payer: Self-pay | Admitting: Internal Medicine

## 2012-10-31 ENCOUNTER — Ambulatory Visit (INDEPENDENT_AMBULATORY_CARE_PROVIDER_SITE_OTHER): Payer: Medicare Other | Admitting: Internal Medicine

## 2012-10-31 VITALS — BP 102/60 | HR 60 | Temp 98.5°F | Ht 68.0 in | Wt 140.0 lb

## 2012-10-31 DIAGNOSIS — R0602 Shortness of breath: Secondary | ICD-10-CM

## 2012-10-31 DIAGNOSIS — I2699 Other pulmonary embolism without acute cor pulmonale: Secondary | ICD-10-CM

## 2012-10-31 LAB — RENAL FUNCTION PANEL
Albumin: 3.7 g/dL (ref 3.5–5.2)
BUN: 27 mg/dL — ABNORMAL HIGH (ref 6–23)
CO2: 22 mEq/L (ref 19–32)
Glucose, Bld: 85 mg/dL (ref 70–99)
Potassium: 4.7 mEq/L (ref 3.5–5.3)
Sodium: 138 mEq/L (ref 135–145)

## 2012-10-31 LAB — LIPID PANEL
Cholesterol: 116 mg/dL (ref 0–200)
HDL: 27 mg/dL — ABNORMAL LOW (ref >39)
Total CHOL/HDL Ratio: 4.3 Ratio
VLDL: 30 mg/dL (ref 0–40)

## 2012-10-31 LAB — PROTIME-INR: INR: 5.36 (ref <1.50)

## 2012-10-31 NOTE — Telephone Encounter (Signed)
Critical Call Report for Pt PT/INR: PT - 45.8 INR - 5.36 Please Advise/SLS

## 2012-10-31 NOTE — Patient Instructions (Signed)
Hold your coumadin until you talk to Dr Vena Rua office - this should happen in the next 24 hours to get a new recommendation on how to take your coumadin  The standard treatment for blood clots like yours is 6 months of coumadin then stop and recheck your legs for blood clots and avoid medications that increase risk of blood clots like megace.  Please remember to go to the x-ray department downstairs for your tests - we will call you with the results when they are available.    Pulmonary follow up is as needed with Dr Vassie Loll, your pulmonologist of record.

## 2012-10-31 NOTE — Progress Notes (Signed)
Subjective:    Patient ID: Sergio Anderson, male    DOB: [birth year]    MRN: [id number]  HPI  25 yowm never smoker referred by Dr Rogelia Rohrer for pulmonary eval on 10/31/12 p following admit for PE while on megace  Admit date: 09/05/2012  Discharge date: 09/12/2012   Discharge Diagnoses:  Principal Problem:  Acute pulmonary embolism  Discharge Condition: fair  Diet recommendation: vitamin K controlled diet  Filed Weights    09/09/12 0646  09/11/12 0351  09/12/12 0529   Weight:  64.4 kg (141 lb 15.6 oz)  66 kg (145 lb 8.1 oz)  64.501 kg (142 lb 3.2 oz)   History of present illness:  67 Caucasian male admitted 09/05/2012 with constant right-sided chest pain with movement palpitations-current, embolism started on heparin drip-found also to have community-acquired pneumonia  Hospital Course:  Assessment/Plan:  1-CP/SOB: secondary to PNA and PE  -continue coumadin, no elevated INR, too high and unsafe at this moment to discharge  -2-D echo with normal right side heart  -will continue tx with augmentin to complete antibiotic therapy (day 7/8)-needs only one more day  -strep pneumonia antigen in urine and also legionella antigen negative.  -afebrile; WBC's trending down  -EKG and cardiac enzymes negative for ACS  2-PE: continue lovenox and coumadin per pharmacy. INR 5.37 and it trended down on 8/20 To 4.6  -lovenox stopped  -INR supra-therapeutic-has followup scheduled at [facility]-HHRN to follow INR at home at discharge  3-HTN: currently soft, but stable. Will continue holding antihypertensive agents amlodipine 10 mg  4-BPH/prostate cancer- continue flomax will need outpatient urology followup  5-HLD: continue statins , Zocor 20 daily  6-GERD: continue PPI  7-Acute on chronic renal failure (stage 3 with Cr baseline of 1.3-1.4): secondary to dehydration most likely and decrease perfusion with low BP.  -Cr. Pretty much back to baseline after IVF's base on his GFR.  -will continue  holding antihypertensive agents for now  -UA w/o signs of UTI  -will monitor  8-Back pain and wekaness: per PT/OT recommendations patient will benefit of HH at discharge  10-Insomnia: will continue PRN xanax (low dose)  11-Diastolic heart failure: grade 1; compensated.  12-Constipation: will continue patient on colace and miralax   10/31/2012 f/u ov/Ashika Apuzzo re:  Chief Complaint  Patient presents with  . Pulmonary Consult    Referred per Dr. Reuel Derby. Pt with recent PE.  He c/o having SOB for the past 2 years.  He gets SOB walking to [place] and back.     Doe =baseline and no pain, never had cough only abrupt R sided cp prior to above admit and sob indolent onset progressive prior to onset of pain - pain has resolved prior to OV  But doe back to baseline - note prev pulmonary w/u by Vassie Loll neg 10/2011 including pft's and cardiac w/u.   No obvious day to day or daytime variabilty or assoc chronic cough or cp or chest tightness, subjective wheeze overt sinus or hb symptoms. No unusual exp hx or h/o childhood pna/ asthma or knowledge of premature birth.  Sleeping ok without nocturnal  or early am exacerbation  of respiratory  c/o's or need for noct saba. Also denies any obvious fluctuation of symptoms with weather or environmental changes or other aggravating or alleviating factors except as outlined above   Current Medications, Allergies, Complete Past Medical History, Past Surgical History, Family History, and Social History were reviewed in [employer] record.  Review of Systems  Constitutional: Negative for fever, chills, activity change, appetite change and unexpected weight change.  HENT: Negative for congestion, dental problem, postnasal drip, rhinorrhea, sneezing, sore throat, trouble swallowing and voice change.   Eyes: Negative for visual disturbance.  Respiratory: Positive for shortness of breath. Negative for cough and choking.   Cardiovascular:  Negative for chest pain and leg swelling.  Gastrointestinal: Negative for nausea, vomiting and abdominal pain.  Genitourinary: Negative for difficulty urinating.  Musculoskeletal: Negative for arthralgias.  Skin: Negative for rash.  Psychiatric/Behavioral: Negative for behavioral problems and confusion.       Objective:   Physical Exam  amb wm confused re details of care  Wt Readings from Last 3 Encounters:  10/31/12 140 lb (63.504 kg)  10/30/12 139 lb 1.9 oz (63.104 kg)  09/12/12 142 lb 3.2 oz (64.501 kg)      HEENT: nl dentition, turbinates, and orophanx. Nl external ear canals without cough reflex   NECK :  without JVD/Nodes/TM/ nl carotid upstrokes bilaterally   LUNGS: no acc muscle use, clear to A and P bilaterally without cough on insp or exp maneuvers   CV:  RRR  no s3 or murmur or increase in P2, no edema   ABD:  soft and nontender with nl excursion in the supine position. No bruits or organomegaly, bowel sounds nl  MS:  warm without deformities, calf tenderness, cyanosis or clubbing  SKIN: warm and dry without lesions    NEURO:  alert, approp, no deficits      CXR  10/31/2012 :  Stable cardiomegaly with moderate hiatal hernia. A vague nodular opacity remains at the left lung base most consistent with scarring. Recommend followup.      Assessment & Plan:

## 2012-10-31 NOTE — Telephone Encounter (Signed)
Needs to hold coumadin and recheck on Friday

## 2012-10-31 NOTE — Telephone Encounter (Signed)
Rx request to pharmacy/SLS  

## 2012-11-01 ENCOUNTER — Telehealth: Payer: Self-pay | Admitting: Family Medicine

## 2012-11-01 NOTE — Progress Notes (Signed)
Quick Note:  Patient Informed and voiced understanding.  Orders entered in system ______

## 2012-11-01 NOTE — Telephone Encounter (Signed)
i am unable to take on any new patients at this time.

## 2012-11-01 NOTE — Progress Notes (Signed)
Quick Note:  Spoke with pt and notified of results per Dr. Wert. Pt verbalized understanding and denied any questions.  ______ 

## 2012-11-01 NOTE — Telephone Encounter (Signed)
Patient informed and will come in on Friday. Order placed

## 2012-11-01 NOTE — Telephone Encounter (Signed)
Pt came in today wanting to switch to [facility] location since it is closer than [facility].  He has [facility] Medicare. Please advise.

## 2012-11-01 NOTE — Telephone Encounter (Signed)
So please make sure he has his INR checked. See if he can at least use the [facility] cou[facility] clinic to check hi INR, he might need to get it checked at lab for Friday but then we can set him up with coumadin clinic for next week. He can go to [facility] or [facility] or come here.

## 2012-11-02 ENCOUNTER — Telehealth: Payer: Self-pay | Admitting: *Deleted

## 2012-11-02 DIAGNOSIS — R911 Solitary pulmonary nodule: Secondary | ICD-10-CM

## 2012-11-02 NOTE — Assessment & Plan Note (Addendum)
-   10/31/2012  Walked RA x 3 laps @ 185 ft each stopped due to end of study no desat or sob  Not able to reproduce this chonic daily symptom at ov so not clear whether further w/u likely to be of benefit and note Dr Vassie Loll reached same conclusion in 10//09/2011 so unless symptoms worsen pulmonary f/u is prn and then should occur with Dr Vassie Loll, his pulmonologist of record

## 2012-11-02 NOTE — Telephone Encounter (Signed)
Pt informed. And states he wants to go to [facility]. Order put in

## 2012-11-02 NOTE — Telephone Encounter (Signed)
Spoke with pt who states he will go to the [facility] lab today to have his INR rechecked

## 2012-11-02 NOTE — Telephone Encounter (Signed)
Received call from [place] with [facility] lab calling to report PT / INR from 11/02/12.  PT  31.1,  INR 3.18.  She will fax result to [place].  Please advise.

## 2012-11-02 NOTE — Assessment & Plan Note (Signed)
Dx  09/05/12 with tds on Echo but no obvious R sided problems - developed on megace, no venous dopplers done   rec min of 6 m coumadin then do venous dopplers and if neg stop coumadin as long as off megace and no other risk factors identified  He seems too frail to me to risk coumadin the rest of his life given need for tight control and f/u

## 2012-11-02 NOTE — Telephone Encounter (Signed)
Please try and refer this guy to the coumadin clinic at [facility] if possible this coming week. If you can at least find me a number and whom to contact that would be very helpful. THX

## 2012-11-02 NOTE — Telephone Encounter (Signed)
Order was put in.

## 2012-11-02 NOTE — Telephone Encounter (Signed)
So he needs an order to have PT/INR checked today at [facility]

## 2012-11-02 NOTE — Telephone Encounter (Signed)
Please have him restart Warfarin 2 mg tab but 1/2 tab daily on Sunday and will need to have have his PT/INR on 11/05/12

## 2012-11-04 NOTE — Assessment & Plan Note (Addendum)
Have stopped Megace. Patient has difficulty getting here due to the drive so had not presented to have his INR checked. It was elevated above 5 but with holding dropped to .13. Will restart at 1  Mg daily on 10/12 recheck PT/INR on 10/13. Tolerating Coumadin

## 2012-11-04 NOTE — Assessment & Plan Note (Signed)
Persistent, stable

## 2012-11-04 NOTE — Assessment & Plan Note (Signed)
Tolerating Simvastatin, no changes after reviewing lipid panel

## 2012-11-04 NOTE — Assessment & Plan Note (Signed)
Well controlled, no changes 

## 2012-11-04 NOTE — Assessment & Plan Note (Signed)
Mild improved.

## 2012-11-05 ENCOUNTER — Telehealth: Payer: Self-pay | Admitting: Family Medicine

## 2012-11-05 NOTE — Telephone Encounter (Signed)
See previous note. I think transfer is a great idea I just need someone to check his INR today or tomorrow I am happy to manage until he is established with [facility] but I have never been given permission to use the lab at [facility]. If they will let me order a PT/INR there temporarily I will (til transfer is complete). Please advise

## 2012-11-05 NOTE — Telephone Encounter (Signed)
No worries, I suggested this to patient because he lives much closer to [place] and the drive is getting tough. Please make sure he gets his INR checked somewhere today or tomorrow during the transition I will continue to monitor until he is set up with [facility] and Coumadin clinic

## 2012-11-05 NOTE — Telephone Encounter (Signed)
Pt is stopping by to request a transfer from Dr. Abner Greenspan, to [facility]. Pt states that this location would be much more convenient to have his coumadin checked at. Please advise.

## 2012-11-06 ENCOUNTER — Ambulatory Visit (INDEPENDENT_AMBULATORY_CARE_PROVIDER_SITE_OTHER): Payer: Medicare Other | Admitting: Family

## 2012-11-06 DIAGNOSIS — Z7901 Long term (current) use of anticoagulants: Secondary | ICD-10-CM

## 2012-11-06 DIAGNOSIS — I2699 Other pulmonary embolism without acute cor pulmonale: Secondary | ICD-10-CM

## 2012-11-06 NOTE — Patient Instructions (Signed)
Take 1 whole tablet today only. Then increase to 1/2 tablet daily. Recheck in 10 days.   Anticoagulation Dose Instructions as of 11/06/2012     Sergio Anderson Tue Wed Thu Fri Sat   New Dose 1.25 mg 1.25 mg 1.25 mg 1.25 mg 1.25 mg 1.25 mg 1.25 mg    Description       Take 1 whole tablet today only. Then increase to 1/2 tablet daily. Recheck in 10 days.

## 2012-11-08 NOTE — Telephone Encounter (Signed)
Pt was seen on 11-06-12 at [facility] and had his PT/INR done

## 2012-11-15 ENCOUNTER — Ambulatory Visit (INDEPENDENT_AMBULATORY_CARE_PROVIDER_SITE_OTHER): Payer: Medicare Other | Admitting: Family

## 2012-11-15 ENCOUNTER — Ambulatory Visit: Payer: Medicare Other

## 2012-11-15 ENCOUNTER — Ambulatory Visit: Payer: Medicare Other | Admitting: Family

## 2012-11-15 ENCOUNTER — Encounter: Payer: Self-pay | Admitting: Family

## 2012-11-15 VITALS — BP 110/60 | HR 54 | Wt 137.0 lb

## 2012-11-15 DIAGNOSIS — I2699 Other pulmonary embolism without acute cor pulmonale: Secondary | ICD-10-CM

## 2012-11-15 DIAGNOSIS — N400 Enlarged prostate without lower urinary tract symptoms: Secondary | ICD-10-CM

## 2012-11-15 DIAGNOSIS — Z7901 Long term (current) use of anticoagulants: Secondary | ICD-10-CM

## 2012-11-15 DIAGNOSIS — I10 Essential (primary) hypertension: Secondary | ICD-10-CM

## 2012-11-15 DIAGNOSIS — E7800 Pure hypercholesterolemia, unspecified: Secondary | ICD-10-CM

## 2012-11-15 LAB — COMPREHENSIVE METABOLIC PANEL
ALT: 10 U/L (ref 0–53)
AST: 16 U/L (ref 0–37)
Albumin: 3.6 g/dL (ref 3.5–5.2)
BUN: 27 mg/dL — ABNORMAL HIGH (ref 6–23)
CO2: 21 mEq/L (ref 19–32)
Calcium: 9.2 mg/dL (ref 8.4–10.5)
Chloride: 110 mEq/L (ref 96–112)
Creatinine, Ser: 1.7 mg/dL — ABNORMAL HIGH (ref 0.4–1.5)
GFR: 42.12 mL/min — ABNORMAL LOW (ref >60.00)
Potassium: 4.2 mEq/L (ref 3.5–5.1)
Total Protein: 7.1 g/dL (ref 6.0–8.3)

## 2012-11-15 LAB — POCT INR: INR: 1.8

## 2012-11-15 MED ORDER — TAMSULOSIN HCL 0.4 MG PO CAPS: ORAL_CAPSULE | ORAL | Status: DC

## 2012-11-15 MED ORDER — RIVAROXABAN 20 MG PO TABS: 20.0000 mg | ORAL_TABLET | Freq: Every day | ORAL | Status: DC

## 2012-11-15 NOTE — Progress Notes (Signed)
Subjective:    Patient ID: Sergio Anderson, male    DOB: 06-24-1926, 79 y.o.   MRN: [id number]  HPI 79 year old white male, new patient to the practice and to be established. He has a history of hypertension, pulmonary embolism 2 months ago, BPH, history of prostate cancer, and hyperlipidemia. He is currently stable on medications. Denies any concerns today.   Review of Systems  Constitutional: Negative.   HENT: Negative.   Respiratory: Negative.   Cardiovascular: Negative.   Gastrointestinal: Negative.   Endocrine: Negative.   Genitourinary: Negative.   Musculoskeletal: Negative.   Skin: Negative.   Allergic/Immunologic: Negative.   Hematological: Negative.   Psychiatric/Behavioral: Negative.    Past Medical History  Diagnosis Date  . SKIN CANCER, HX OF 03/01/2010  . PUD, HX OF 03/01/2010  . PROSTATITIS, ACUTE 04/01/2010  . Nocturia 03/01/2010  . HYPERLIPIDEMIA 03/01/2010  . ELEVATED BP READING WITHOUT DX HYPERTENSION 03/01/2010  . COLONIC POLYPS, HX OF 03/01/2010  . CATARACT EXTRACTIONS, BILATERAL, HX OF 03/01/2010  . BACK PAIN, LUMBAR, WITH RADICULOPATHY 04/01/2010  . Pulmonary nodule 09/14/2010  . HTN (hypertension) 10/05/2010  . Sinusitis 03/30/2011  . Dermatitis 03/30/2011  . Fatigue 05/03/2011  . Ascending aorta dilatation 09/02/2011  . SOB (shortness of breath) 09/04/2011  . Tremor 12/23/2011  . Anemia 12/23/2011  . Bruising 07/30/2012  . PROSTATE CANCER 03/01/2010    History   Social History  . Marital Status: Married    Spouse Name: N/A    Number of Children: N/A  . Years of Education: N/A   Occupational History  . [employer]- Retired    Social History Main Topics  . Smoking status: Never Smoker   . Smokeless tobacco: Never Used  . Alcohol Use: No  . Drug Use: No  . Sexual Activity: Not on file   Other Topics Concern  . Not on file   Social History Narrative  . No narrative on file    Past Surgical History  Procedure Laterality Date  . Abdominal  surgery  1960    Correction of PUD and hiatal hernia  . Tonsillectomy and adenoidectomy    . Colonoscopy w/ biopsies and polypectomy  1980's    several, benign  . Mose surgery to scalp      Family History  Problem Relation Age of Onset  . Stroke Mother   . Hip fracture Mother   . Lung disease Father     smoker  . Cancer Sister     lung/ smoker  . Heart disease Brother   . Heart attack Brother   . Alcohol abuse Maternal Grandfather   . Cancer Paternal Grandmother     colon  . Pneumonia Brother   . Alcohol abuse Brother   . Achalasia Son     Allergies  Allergen Reactions  . Vioxx [Rofecoxib]     Current Outpatient Prescriptions on File Prior to Visit  Medication Sig Dispense Refill  . Clobetasol Propionate 0.05 % lotion Apply 1 application topically at bedtime. Applied to lower back.      . metoprolol tartrate (LOPRESSOR) 25 MG tablet       . simvastatin (ZOCOR) 20 MG tablet TAKE 1 TABLET BY MOUTH EVERY NIGHT AT BEDTIME  90 tablet  1   No current facility-administered medications on file prior to visit.    BP 110/60  Pulse 54  Wt 137 lb (62.143 kg)  BMI 20.84 kg/m2chart    Objective:   Physical Exam  Constitutional: He is oriented to person, place, and time. He appears well-developed and well-nourished.  HENT:  Right Ear: External ear normal.  Left Ear: External ear normal.  Nose: Nose normal.  Mouth/Throat: Oropharynx is clear and moist.  Neck: Normal range of motion.  Cardiovascular: Normal rate.   Murmur heard. 2/6 systolic ejection murmur  Pulmonary/Chest: Effort normal and breath sounds normal.  Abdominal: Soft. Bowel sounds are normal.  Musculoskeletal: Normal range of motion.  Neurological: He is alert and oriented to person, place, and time. He displays normal reflexes. No cranial nerve deficit. Coordination normal.  Skin: Skin is warm and dry.  Psychiatric: He has a normal mood and affect.          Assessment & Plan:  Assessment: 1.  Hypertension 2. Pulmonary embolism on Coumadin 3. BPH 4. Hyperlipidemia 5. History of prostate cancer  Plan: CMP sent today. Notify patient of results. Consider starting Xarelto 20 mg once daily as found his GFR is greater than 50. If not, we may consider Pradaxa. Recheck in 3 weeks.

## 2012-11-16 ENCOUNTER — Other Ambulatory Visit: Payer: Self-pay | Admitting: Family

## 2012-11-16 MED ORDER — DABIGATRAN ETEXILATE MESYLATE 150 MG PO CAPS: 150.0000 mg | ORAL_CAPSULE | Freq: Two times a day (BID) | ORAL | Status: DC

## 2012-11-23 ENCOUNTER — Ambulatory Visit: Payer: Medicare Other | Admitting: Family

## 2012-11-27 ENCOUNTER — Ambulatory Visit: Payer: Medicare Other | Admitting: Family Medicine

## 2012-12-03 ENCOUNTER — Ambulatory Visit: Payer: Medicare Other

## 2012-12-17 ENCOUNTER — Encounter: Payer: Self-pay | Admitting: Family

## 2012-12-17 ENCOUNTER — Ambulatory Visit (INDEPENDENT_AMBULATORY_CARE_PROVIDER_SITE_OTHER): Payer: Medicare Other | Admitting: Family

## 2012-12-17 VITALS — BP 120/62 | HR 56 | Wt 141.0 lb

## 2012-12-17 DIAGNOSIS — Z7901 Long term (current) use of anticoagulants: Secondary | ICD-10-CM

## 2012-12-17 DIAGNOSIS — I2699 Other pulmonary embolism without acute cor pulmonale: Secondary | ICD-10-CM

## 2012-12-17 DIAGNOSIS — I10 Essential (primary) hypertension: Secondary | ICD-10-CM

## 2012-12-17 DIAGNOSIS — Z79899 Other long term (current) drug therapy: Secondary | ICD-10-CM

## 2012-12-17 LAB — POCT INR: INR: 1.9

## 2012-12-17 MED ORDER — WARFARIN SODIUM 2.5 MG PO TABS: 2.5000 mg | ORAL_TABLET | Freq: Once | ORAL | Status: DC

## 2012-12-17 MED ORDER — CLOBETASOL PROPIONATE 0.05 % EX LOTN: 1.0000 "application " | TOPICAL_LOTION | Freq: Every day | CUTANEOUS | Status: AC

## 2012-12-17 NOTE — Patient Instructions (Addendum)
Take 1 tablet every Tuesday only, the continue 1/2 tablet daily all other days. Recheck in 3 weeks  Anticoagulation Dose Instructions as of 12/17/2012     Glynis Smiles Tue Wed Thu Fri Sat   New Dose 1.25 mg 1.25 mg 2.5 mg 1.25 mg 1.25 mg 1.25 mg 1.25 mg    Description       Take 1 tablet every Tuesday only, the continue 1/2 tablet daily all other days. Recheck in 3 weeks.

## 2012-12-17 NOTE — Progress Notes (Signed)
Pre visit review using our clinic review tool, if applicable. No additional management support is needed unless otherwise documented below in the visit note. 

## 2012-12-17 NOTE — Progress Notes (Signed)
Subjective:    Patient ID: Sergio Anderson, male    DOB: 1926-11-01, 79 y.o.   MRN: [id number]  HPI  79 year old white male, nonsmoker is in for a recheck today. He is on anticoagulation therapy after developing a pulmonary embolism. He was on Coumadin. We consider switching him to Xarelto but after his renal function was assessed, we decided to go with Pradaxa. Patient never started neither new agent and is still taking Coumadin. He would like to resume on Coumadin for the duration of his therapy.  Review of Systems  Constitutional: Negative.   Respiratory: Negative.   Cardiovascular: Negative.   Gastrointestinal: Negative.   Genitourinary: Negative.   Musculoskeletal: Negative.   Skin: Negative.   Neurological: Negative.   Psychiatric/Behavioral: Negative.    Past Medical History  Diagnosis Date  . SKIN CANCER, HX OF 03/01/2010  . PUD, HX OF 03/01/2010  . PROSTATITIS, ACUTE 04/01/2010  . Nocturia 03/01/2010  . HYPERLIPIDEMIA 03/01/2010  . ELEVATED BP READING WITHOUT DX HYPERTENSION 03/01/2010  . COLONIC POLYPS, HX OF 03/01/2010  . CATARACT EXTRACTIONS, BILATERAL, HX OF 03/01/2010  . BACK PAIN, LUMBAR, WITH RADICULOPATHY 04/01/2010  . Pulmonary nodule 09/14/2010  . HTN (hypertension) 10/05/2010  . Sinusitis 03/30/2011  . Dermatitis 03/30/2011  . Fatigue 05/03/2011  . Ascending aorta dilatation 09/02/2011  . SOB (shortness of breath) 09/04/2011  . Tremor 12/23/2011  . Anemia 12/23/2011  . Bruising 07/30/2012  . PROSTATE CANCER 03/01/2010    History   Social History  . Marital Status: Married    Spouse Name: N/A    Number of Children: N/A  . Years of Education: N/A   Occupational History  . [employer]- Retired    Social History Main Topics  . Smoking status: Never Smoker   . Smokeless tobacco: Never Used  . Alcohol Use: No  . Drug Use: No  . Sexual Activity: Not on file   Other Topics Concern  . Not on file   Social History Narrative  . No narrative on file    Past  Surgical History  Procedure Laterality Date  . Abdominal surgery  1960    Correction of PUD and hiatal hernia  . Tonsillectomy and adenoidectomy    . Colonoscopy w/ biopsies and polypectomy  1980's    several, benign  . Mose surgery to scalp      Family History  Problem Relation Age of Onset  . Stroke Mother   . Hip fracture Mother   . Lung disease Father     smoker  . Cancer Sister     lung/ smoker  . Heart disease Brother   . Heart attack Brother   . Alcohol abuse Maternal Grandfather   . Cancer Paternal Grandmother     colon  . Pneumonia Brother   . Alcohol abuse Brother   . Achalasia Son     Allergies  Allergen Reactions  . Vioxx [Rofecoxib]     Current Outpatient Prescriptions on File Prior to Visit  Medication Sig Dispense Refill  . metoprolol tartrate (LOPRESSOR) 25 MG tablet       . simvastatin (ZOCOR) 20 MG tablet TAKE 1 TABLET BY MOUTH EVERY NIGHT AT BEDTIME  90 tablet  1  . tamsulosin (FLOMAX) 0.4 MG CAPS capsule TAKE ONE CAPSULE BY MOUTH DAILY  90 capsule  1   No current facility-administered medications on file prior to visit.    BP 120/62  Pulse 56  Wt 141 lb (63.957 kg)chart  Objective:   Physical Exam  Constitutional: He is oriented to person, place, and time. He appears well-developed and well-nourished.  HENT:  Right Ear: External ear normal.  Left Ear: External ear normal.  Nose: Nose normal.  Mouth/Throat: Oropharynx is clear and moist.  Neck: Normal range of motion. Neck supple. No thyromegaly present.  Cardiovascular: Normal rate, regular rhythm and normal heart sounds.   Pulmonary/Chest: Effort normal and breath sounds normal.  Abdominal: Soft. Bowel sounds are normal.  Musculoskeletal: Normal range of motion.  Neurological: He is alert and oriented to person, place, and time.  Skin: Skin is warm and dry.  Psychiatric: He has a normal mood and affect.          Assessment & Plan:  Assessment: 1. Pulmonary embolism 2. High  Risk medication usage  Plan: Continue Coumadin. Follow with Coumadin clinic as directed.

## 2013-01-07 ENCOUNTER — Ambulatory Visit: Payer: Medicare Other

## 2013-01-30 ENCOUNTER — Other Ambulatory Visit: Payer: Self-pay | Admitting: Family Medicine

## 2013-04-08 ENCOUNTER — Other Ambulatory Visit: Payer: Self-pay | Admitting: Family

## 2013-04-15 ENCOUNTER — Other Ambulatory Visit: Payer: Self-pay | Admitting: Family Medicine

## 2013-04-15 DIAGNOSIS — Z86711 Personal history of pulmonary embolism: Secondary | ICD-10-CM

## 2013-04-16 ENCOUNTER — Ambulatory Visit (INDEPENDENT_AMBULATORY_CARE_PROVIDER_SITE_OTHER): Payer: Medicare Other | Admitting: General Practice

## 2013-04-16 ENCOUNTER — Ambulatory Visit
Admission: RE | Admit: 2013-04-16 | Discharge: 2013-04-16 | Disposition: A | Discharge status: Home / Self Care | Payer: Medicare Other | Source: Ambulatory Visit | Attending: Family Medicine | Admitting: Family Medicine

## 2013-04-16 DIAGNOSIS — Z86711 Personal history of pulmonary embolism: Secondary | ICD-10-CM

## 2013-04-19 ENCOUNTER — Other Ambulatory Visit: Payer: Self-pay | Admitting: Family

## 2013-04-23 ENCOUNTER — Other Ambulatory Visit: Payer: Self-pay | Admitting: Family

## 2014-04-01 IMAGING — CR DG CHEST 2V
2 series · 2 of 2 positions shown · non-contrast
Comparison: 11/28/2008.

CLINICAL DATA: Chest pain, shortness of breath and weakness.

CHEST - 2 VIEW

[w chest lat]
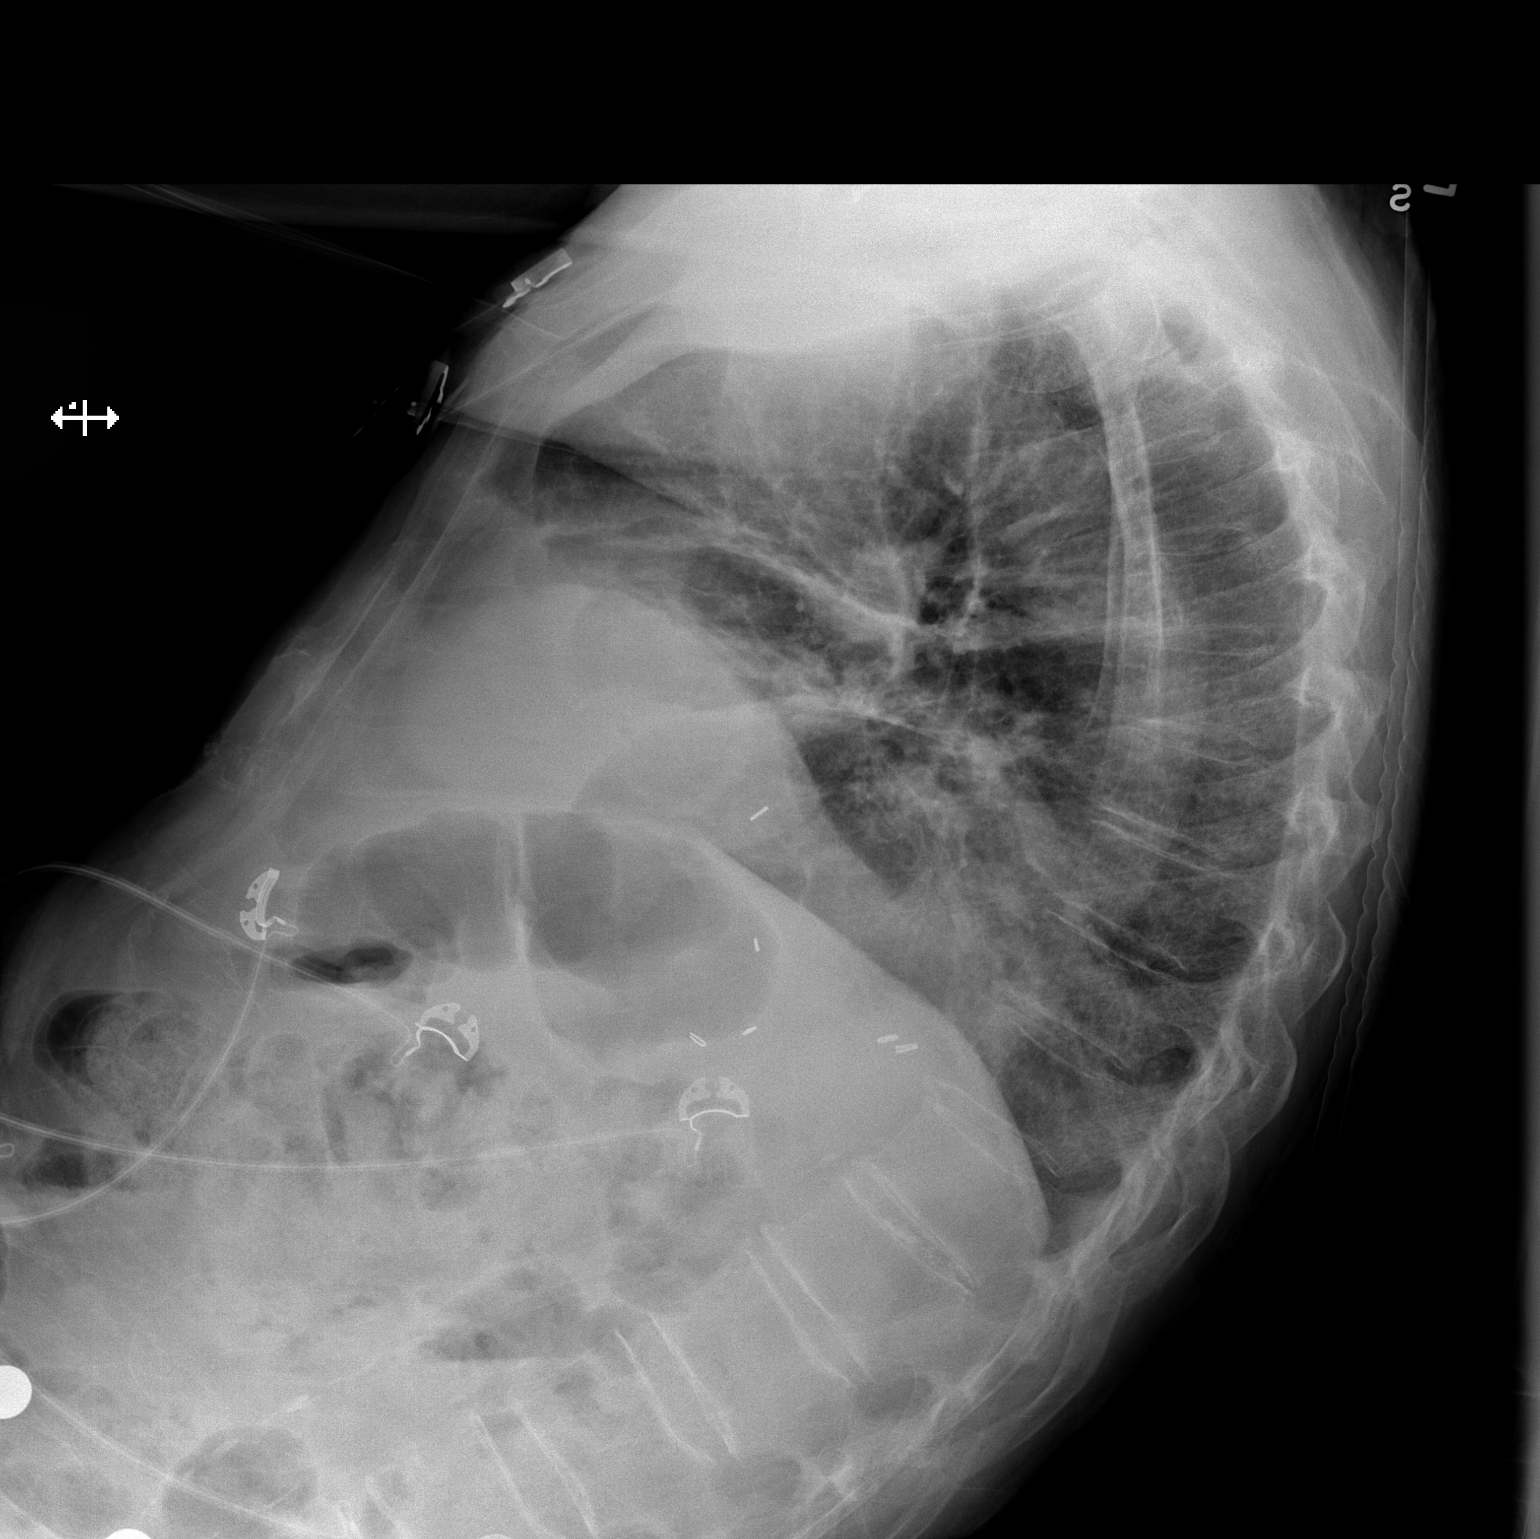

[x chest ap]
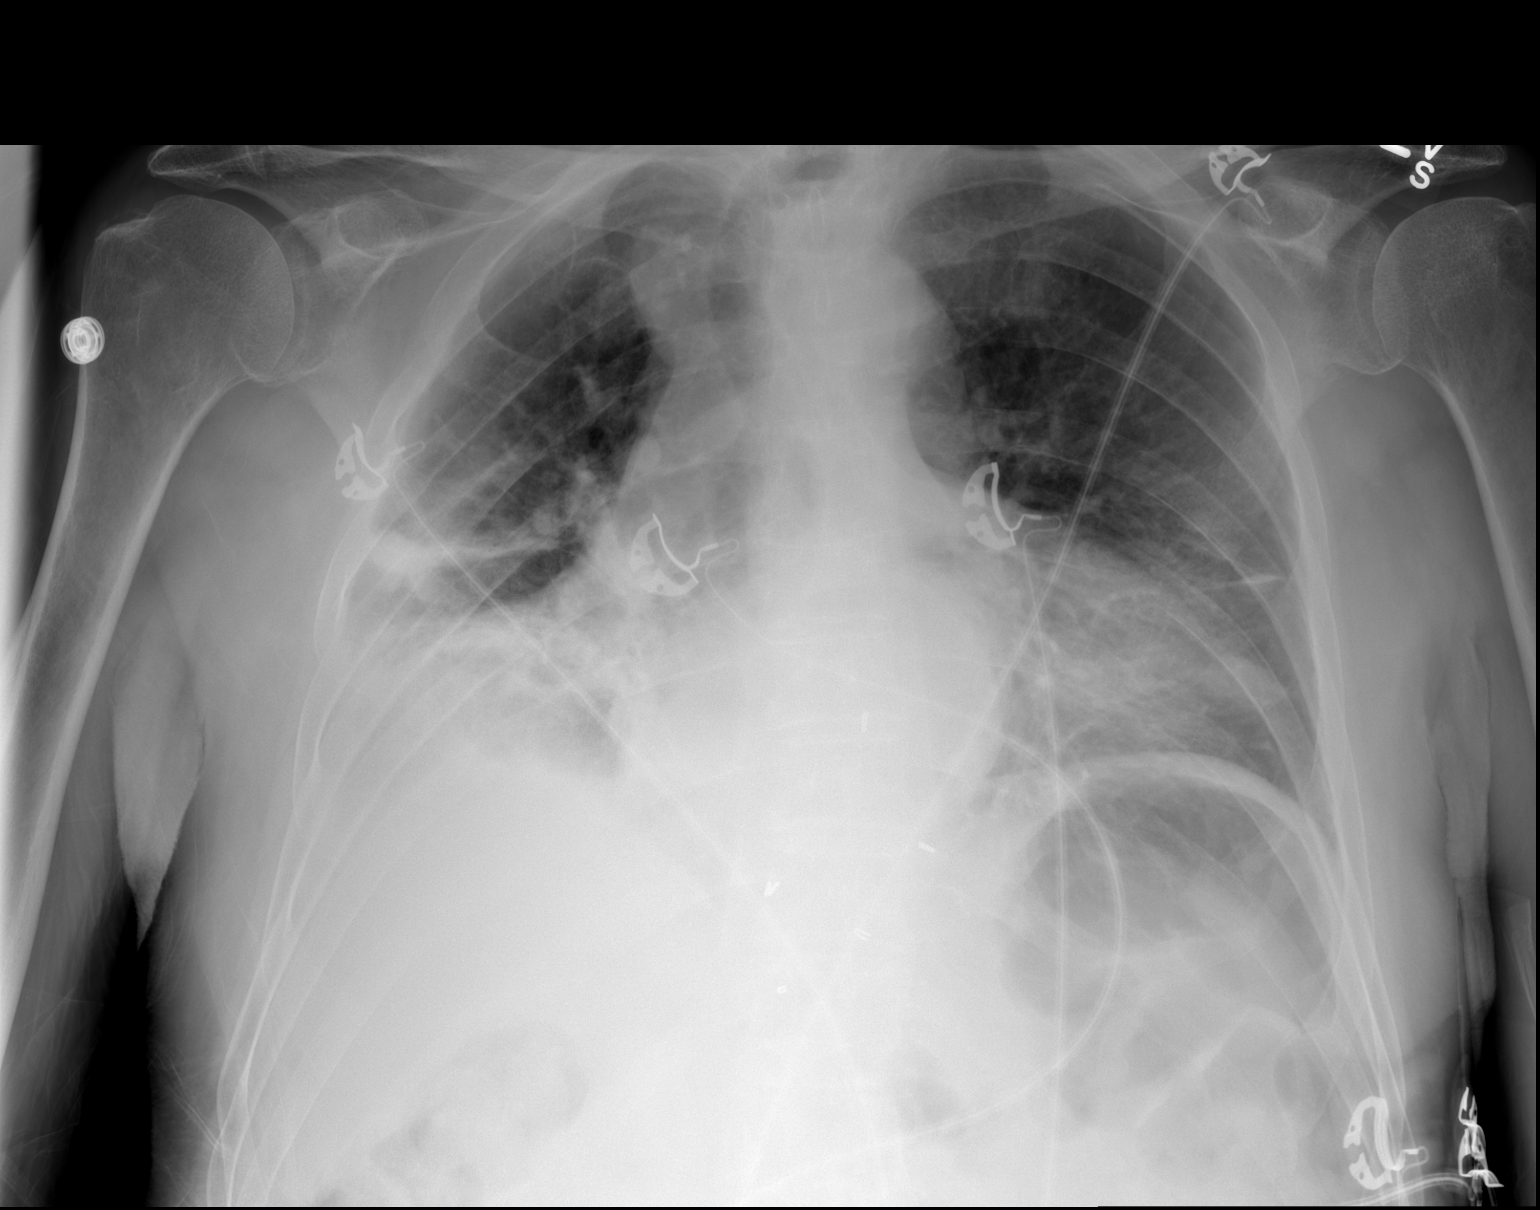

[2 of 2 positions shown; findings below may reference images not displayed]

FINDINGS: Trachea is midline.  Heart is enlarged and accentuated by
semi upright AP technique and low lung volumes.  There is right
basilar airspace disease adjacent to an elevated right
hemidiaphragm.  Difficult to exclude a tiny right pleural effusion.
Linear scarring in the lingula.  A nodular density projects over
the cardiac apex.
IMPRESSION: 1.  Right basilar airspace disease may be due to pneumonia.
Difficult to exclude a tiny right pleural effusion.
2.  Small rounded density projecting over the cardiac apex, at the
left lung base.  The patient is scheduled for a CT chest the same
day.

## 2015-10-20 ENCOUNTER — Emergency Department (HOSPITAL_COMMUNITY): Payer: Medicare Other

## 2015-10-20 ENCOUNTER — Encounter (HOSPITAL_COMMUNITY): Payer: Self-pay

## 2015-10-20 ENCOUNTER — Observation Stay (HOSPITAL_COMMUNITY)
Admission: EM | Admit: 2015-10-20 | Discharge: 2015-10-22 | Disposition: A | Discharge status: Home / Self Care | Payer: Medicare Other | Attending: Internal Medicine | Admitting: Internal Medicine

## 2015-10-20 DIAGNOSIS — E43 Unspecified severe protein-calorie malnutrition: Secondary | ICD-10-CM | POA: Diagnosis not present

## 2015-10-20 DIAGNOSIS — T148XXA Other injury of unspecified body region, initial encounter: Secondary | ICD-10-CM

## 2015-10-20 DIAGNOSIS — Z86010 Personal history of colonic polyps: Secondary | ICD-10-CM | POA: Diagnosis not present

## 2015-10-20 DIAGNOSIS — N281 Cyst of kidney, acquired: Secondary | ICD-10-CM | POA: Insufficient documentation

## 2015-10-20 DIAGNOSIS — M5416 Radiculopathy, lumbar region: Secondary | ICD-10-CM | POA: Diagnosis not present

## 2015-10-20 DIAGNOSIS — Z823 Family history of stroke: Secondary | ICD-10-CM | POA: Insufficient documentation

## 2015-10-20 DIAGNOSIS — I2510 Atherosclerotic heart disease of native coronary artery without angina pectoris: Secondary | ICD-10-CM | POA: Diagnosis not present

## 2015-10-20 DIAGNOSIS — Z681 Body mass index (BMI) 19 or less, adult: Secondary | ICD-10-CM | POA: Insufficient documentation

## 2015-10-20 DIAGNOSIS — J189 Pneumonia, unspecified organism: Secondary | ICD-10-CM | POA: Diagnosis present

## 2015-10-20 DIAGNOSIS — J470 Bronchiectasis with acute lower respiratory infection: Secondary | ICD-10-CM | POA: Insufficient documentation

## 2015-10-20 DIAGNOSIS — Z8711 Personal history of peptic ulcer disease: Secondary | ICD-10-CM

## 2015-10-20 DIAGNOSIS — Z7901 Long term (current) use of anticoagulants: Secondary | ICD-10-CM | POA: Diagnosis not present

## 2015-10-20 DIAGNOSIS — Z801 Family history of malignant neoplasm of trachea, bronchus and lung: Secondary | ICD-10-CM | POA: Diagnosis not present

## 2015-10-20 DIAGNOSIS — Z881 Allergy status to other antibiotic agents status: Secondary | ICD-10-CM | POA: Insufficient documentation

## 2015-10-20 DIAGNOSIS — D631 Anemia in chronic kidney disease: Secondary | ICD-10-CM | POA: Diagnosis not present

## 2015-10-20 DIAGNOSIS — X58XXXA Exposure to other specified factors, initial encounter: Secondary | ICD-10-CM | POA: Insufficient documentation

## 2015-10-20 DIAGNOSIS — N183 Chronic kidney disease, stage 3 unspecified: Secondary | ICD-10-CM | POA: Diagnosis present

## 2015-10-20 DIAGNOSIS — E785 Hyperlipidemia, unspecified: Secondary | ICD-10-CM | POA: Insufficient documentation

## 2015-10-20 DIAGNOSIS — I129 Hypertensive chronic kidney disease with stage 1 through stage 4 chronic kidney disease, or unspecified chronic kidney disease: Secondary | ICD-10-CM | POA: Diagnosis not present

## 2015-10-20 DIAGNOSIS — Z8249 Family history of ischemic heart disease and other diseases of the circulatory system: Secondary | ICD-10-CM | POA: Insufficient documentation

## 2015-10-20 DIAGNOSIS — K449 Diaphragmatic hernia without obstruction or gangrene: Secondary | ICD-10-CM | POA: Diagnosis not present

## 2015-10-20 DIAGNOSIS — S1093XA Contusion of unspecified part of neck, initial encounter: Secondary | ICD-10-CM | POA: Insufficient documentation

## 2015-10-20 DIAGNOSIS — I350 Nonrheumatic aortic (valve) stenosis: Secondary | ICD-10-CM | POA: Insufficient documentation

## 2015-10-20 DIAGNOSIS — I4510 Unspecified right bundle-branch block: Secondary | ICD-10-CM | POA: Insufficient documentation

## 2015-10-20 DIAGNOSIS — M542 Cervicalgia: Secondary | ICD-10-CM | POA: Diagnosis not present

## 2015-10-20 DIAGNOSIS — I10 Essential (primary) hypertension: Secondary | ICD-10-CM | POA: Diagnosis present

## 2015-10-20 DIAGNOSIS — C61 Malignant neoplasm of prostate: Secondary | ICD-10-CM | POA: Diagnosis present

## 2015-10-20 DIAGNOSIS — Z8546 Personal history of malignant neoplasm of prostate: Secondary | ICD-10-CM | POA: Diagnosis not present

## 2015-10-20 DIAGNOSIS — Z85828 Personal history of other malignant neoplasm of skin: Secondary | ICD-10-CM | POA: Insufficient documentation

## 2015-10-20 DIAGNOSIS — R079 Chest pain, unspecified: Principal | ICD-10-CM | POA: Insufficient documentation

## 2015-10-20 DIAGNOSIS — Z800 Family history of malignant neoplasm of digestive organs: Secondary | ICD-10-CM | POA: Diagnosis not present

## 2015-10-20 DIAGNOSIS — Z811 Family history of alcohol abuse and dependence: Secondary | ICD-10-CM | POA: Insufficient documentation

## 2015-10-20 LAB — PROTIME-INR
INR: 1.15
PROTHROMBIN TIME: 14.8 s (ref 11.4–15.2)

## 2015-10-20 LAB — CBC WITH DIFFERENTIAL/PLATELET
BASOS ABS: 0 10*3/uL (ref 0.0–0.1)
BASOS PCT: 0 %
EOS ABS: 0.2 10*3/uL (ref 0.0–0.7)
Eosinophils Relative: 2 %
HEMATOCRIT: 33 % — AB (ref 39.0–52.0)
HEMOGLOBIN: 10.7 g/dL — AB (ref 13.0–17.0)
Lymphocytes Relative: 20 %
Lymphs Abs: 1.9 10*3/uL (ref 0.7–4.0)
MCH: 29.8 pg (ref 26.0–34.0)
MCHC: 32.4 g/dL (ref 30.0–36.0)
MCV: 91.9 fL (ref 78.0–100.0)
MONO ABS: 1.3 10*3/uL — AB (ref 0.1–1.0)
Monocytes Relative: 13 %
NEUTROS ABS: 6 10*3/uL (ref 1.7–7.7)
NEUTROS PCT: 65 %
Platelets: 293 10*3/uL (ref 150–400)
RBC: 3.59 MIL/uL — ABNORMAL LOW (ref 4.22–5.81)
RDW: 13.2 % (ref 11.5–15.5)
WBC: 9.5 10*3/uL (ref 4.0–10.5)

## 2015-10-20 LAB — COMPREHENSIVE METABOLIC PANEL
ALBUMIN: 3.3 g/dL — AB (ref 3.5–5.0)
ALT: 8 U/L — ABNORMAL LOW (ref 17–63)
AST: 15 U/L (ref 15–41)
Alkaline Phosphatase: 56 U/L (ref 38–126)
Anion gap: 9 (ref 5–15)
BUN: 36 mg/dL — AB (ref 6–20)
CHLORIDE: 107 mmol/L (ref 101–111)
CO2: 23 mmol/L (ref 22–32)
Calcium: 9.1 mg/dL (ref 8.9–10.3)
Creatinine, Ser: 1.71 mg/dL — ABNORMAL HIGH (ref 0.61–1.24)
GFR calc Af Amer: 40 mL/min — ABNORMAL LOW (ref >60)
GFR calc non Af Amer: 34 mL/min — ABNORMAL LOW (ref >60)
GLUCOSE: 96 mg/dL (ref 65–99)
POTASSIUM: 4.3 mmol/L (ref 3.5–5.1)
Sodium: 139 mmol/L (ref 135–145)
Total Bilirubin: 0.8 mg/dL (ref 0.3–1.2)
Total Protein: 6.9 g/dL (ref 6.5–8.1)

## 2015-10-20 LAB — URINALYSIS, ROUTINE W REFLEX MICROSCOPIC
Glucose, UA: NEGATIVE mg/dL
Hgb urine dipstick: NEGATIVE
KETONES UR: NEGATIVE mg/dL
LEUKOCYTES UA: NEGATIVE
Nitrite: NEGATIVE
PH: 6 (ref 5.0–8.0)
Protein, ur: NEGATIVE mg/dL
Specific Gravity, Urine: 1.019 (ref 1.005–1.030)

## 2015-10-20 LAB — D-DIMER, QUANTITATIVE: D-Dimer, Quant: 1.12 ug/mL-FEU — ABNORMAL HIGH (ref 0.00–0.50)

## 2015-10-20 LAB — TROPONIN I: Troponin I: 0.03 ng/mL (ref <0.03)

## 2015-10-20 LAB — APTT: APTT: 42 s — AB (ref 24–36)

## 2015-10-20 MED ORDER — DEXTROSE 5 % IV SOLN
500.0000 mg | Freq: Once | INTRAVENOUS | Status: AC
  Administered 2015-10-21: 500 mg via INTRAVENOUS
  Filled 2015-10-20: qty 500

## 2015-10-20 MED ORDER — SODIUM CHLORIDE 0.9 % IV BOLUS (SEPSIS)
1000.0000 mL | Freq: Once | INTRAVENOUS | Status: AC
Start: 2015-10-20 — End: 2015-10-20
  Administered 2015-10-20: 1000 mL via INTRAVENOUS

## 2015-10-20 MED ORDER — DEXTROSE 5 % IV SOLN
1.0000 g | Freq: Once | INTRAVENOUS | Status: AC
  Administered 2015-10-20: 1 g via INTRAVENOUS
  Filled 2015-10-20: qty 10

## 2015-10-20 MED ORDER — IOPAMIDOL (ISOVUE-370) INJECTION 76%
100.0000 mL | Freq: Once | INTRAVENOUS | Status: AC | PRN
  Administered 2015-10-20: 71.7 mL via INTRAVENOUS

## 2015-10-20 NOTE — ED Triage Notes (Signed)
Pt woke up Sunday morning with bruising to neck.  Pt bruises easily.  Does not know what he did.  Pt also had blood draw done at MD office yesterday for the bruising.  Told he had "blood infection" and to come to ED. Pt states he is having trouble with his ears.  Told WBC low.

## 2015-10-20 NOTE — ED Notes (Signed)
Patient transported to X-ray 

## 2015-10-20 NOTE — ED Notes (Signed)
Patient transported to CT 

## 2015-10-20 NOTE — ED Provider Notes (Signed)
[facility] DEPT Provider Note   CSN: [id number] Arrival date & time: 10/20/15  1552     History   Chief Complaint No chief complaint on file.   HPI Sergio Anderson is a 82 y.o. male.  A 82 year old Caucasian male with history of CAD, CKD stage 3, hypertension, aortic valve stenosis, prostate cancer, PE that presents to the ED today with Dr. referral. Patient saw his primary care doctor yesterday for right shoulder pain and chest pain. Patient's wife is on hospice and hospice nurse noted the patient to have a large bruise to the right side of his neck. Patient states that he scratched his neck during his sleep this past Saturday night and bruises easily. Patient denies any aspirin or blood thinner use. Hospice nurse also noted the patient to have skipped heartbeat. EKG was performed by PCP that showed no acute changes. Blood work was drawn that showed an elevated white count of 13.9. Dr. Inda Merlin his PCP recommended that he come to the ED for further workup including chest x-ray and urinalysis. Patient complains of bruising to his right neck, neck pain, bilateral ear pain. Patient states that his ear pain started approximately one month ago. Patient states that his neck pain started approximately 3 days ago. He is tried nothing for the pain. Moving makes the pain worse. States he was "messing with his hot tub and lifting something heavy". Seen by PCP and told to take ibuprofen and use heating pad. Patient endorses chills. Denies any cp or sob at this time. Patient denies any fever, headache, vision changes, chest pain, shortness breath, cough, abdominal pain, nausea, vomiting, urinary symptoms, change in bowel habits, hematochezia, melena, or numbness/tingling.      Past Medical History:  Diagnosis Date  . Anemia 12/23/2011  . Ascending aorta dilatation (HCC) 09/02/2011  . BACK PAIN, LUMBAR, WITH RADICULOPATHY 04/01/2010  . Bruising 07/30/2012  . CATARACT EXTRACTIONS, BILATERAL, HX OF  03/01/2010  . COLONIC POLYPS, HX OF 03/01/2010  . Dermatitis 03/30/2011  . ELEVATED BP READING WITHOUT DX HYPERTENSION 03/01/2010  . Fatigue 05/03/2011  . HTN (hypertension) 10/05/2010  . HYPERLIPIDEMIA 03/01/2010  . Nocturia 03/01/2010  . PROSTATE CANCER 03/01/2010  . PROSTATITIS, ACUTE 04/01/2010  . PUD, HX OF 03/01/2010  . Pulmonary nodule 09/14/2010  . Sinusitis 03/30/2011  . SKIN CANCER, HX OF 03/01/2010  . SOB (shortness of breath) 09/04/2011  . Tremor 12/23/2011    Patient Active Problem List   Diagnosis Date Noted  . Long term (current) use of anticoagulants 09/14/2012  . Acute pulmonary embolism ([place]) 09/12/2012  . Bruising 07/30/2012  . Tremor 12/23/2011  . Anemia 12/23/2011  . SOB (shortness of breath) 09/04/2011  . Ascending aorta dilatation (HCC) 09/02/2011  . Fatigue 05/03/2011  . Sinusitis 03/30/2011  . Dermatitis 03/30/2011  . HTN (hypertension) 10/05/2010  . CRF (chronic renal failure) 10/05/2010  . Pulmonary nodule 09/14/2010  . BACK PAIN, LUMBAR, WITH RADICULOPATHY 04/01/2010  . PROSTATITIS, ACUTE 04/01/2010  . PROSTATE CANCER 03/01/2010  . HYPERLIPIDEMIA 03/01/2010  . NOCTURIA 03/01/2010  . SKIN CANCER, HX OF 03/01/2010  . PUD, HX OF 03/01/2010  . COLONIC POLYPS, HX OF 03/01/2010  . CATARACT EXTRACTIONS, BILATERAL, HX OF 03/01/2010    Past Surgical History:  Procedure Laterality Date  . ABDOMINAL SURGERY  1960   Correction of PUD and hiatal hernia  . COLONOSCOPY W/ BIOPSIES AND POLYPECTOMY  1980's   several, benign  . Mose surgery to scalp    . TONSILLECTOMY AND ADENOIDECTOMY  Home Medications    Prior to Admission medications   Medication Sig Start Date End Date Taking? Authorizing Provider  Clobetasol Propionate 0.05 % lotion Apply 1 application topically at bedtime. Applied to lower back. 12/17/12   Kennyth Arnold, FNP  metoprolol tartrate (LOPRESSOR) 25 MG tablet  10/23/12   Historical Provider, MD  simvastatin (ZOCOR) 20 MG tablet TAKE 1 TABLET BY  MOUTH EVERY NIGHT AT BEDTIME 09/14/12   Mosie Lukes, MD  tamsulosin (FLOMAX) 0.4 MG CAPS capsule TAKE ONE CAPSULE BY MOUTH EVERY DAY 04/23/13   Kennyth Arnold, FNP  warfarin (COUMADIN) 2.5 MG tablet Take as directed 04/08/13   Kennyth Arnold, FNP    Family History Family History  Problem Relation Age of Onset  . Stroke Mother   . Hip fracture Mother   . Lung disease Father     smoker  . Cancer Sister     lung/ smoker  . Heart disease Brother   . Heart attack Brother   . Alcohol abuse Maternal Grandfather   . Cancer Paternal Grandmother     colon  . Pneumonia Brother   . Alcohol abuse Brother   . Achalasia Son     Social History Social History  Substance Use Topics  . Smoking status: Never Smoker  . Smokeless tobacco: Never Used  . Alcohol use No     Allergies   Vioxx [rofecoxib]   Review of Systems Review of Systems  Constitutional: Negative for chills and fever.  HENT: Positive for congestion, ear pain, postnasal drip, rhinorrhea and sore throat.   Eyes: Negative for pain and visual disturbance.  Respiratory: Negative for cough and shortness of breath.   Cardiovascular: Negative for chest pain, palpitations and leg swelling.  Gastrointestinal: Negative for abdominal pain, diarrhea, nausea and vomiting.  Genitourinary: Negative for dysuria, flank pain, frequency, hematuria and urgency.  Musculoskeletal: Positive for neck pain.  Skin: Positive for color change.  Neurological: Negative for dizziness, syncope, weakness, light-headedness, numbness and headaches.     Physical Exam Updated Vital Signs BP 149/67 (BP Location: Left Arm)   Pulse 61   Temp 97.8 F (36.6 C) (Oral)   Resp 16   Ht 5\' 8"  (1.727 m)   Wt 60.3 kg   SpO2 99%   BMI 20.22 kg/m   Physical Exam  Constitutional: He appears well-developed and well-nourished. No distress.  HENT:  Head: Normocephalic and atraumatic.  Right Ear: Tympanic membrane and external ear normal. No tenderness.    Left Ear: Tympanic membrane and external ear normal. No tenderness.  Mouth/Throat: Oropharynx is clear and moist and mucous membranes are normal. No oropharyngeal exudate, posterior oropharyngeal edema or posterior oropharyngeal erythema.  Eyes: Conjunctivae and EOM are normal. Pupils are equal, round, and reactive to light. Right eye exhibits no discharge. Left eye exhibits no discharge. No scleral icterus.  Neck: Neck supple. Spinous process tenderness present. No neck rigidity. Decreased range of motion: due to pain. No thyromegaly present.    Cardiovascular: Normal rate, regular rhythm and intact distal pulses.  Exam reveals no gallop and no friction rub.   Murmur (sytolic murmur noted) heard. Pulmonary/Chest: Effort normal and breath sounds normal. No respiratory distress. He has no wheezes.  Abdominal: Soft. Bowel sounds are normal. He exhibits no distension. There is no tenderness. There is no rebound and no guarding.  Musculoskeletal: Normal range of motion.  Lymphadenopathy:    He has no cervical adenopathy.  Neurological: He is alert.  Skin: Skin is  warm and dry. Capillary refill takes less than 2 seconds.  Nursing note and vitals reviewed.    ED Treatments / Results  Labs (all labs ordered are listed, but only abnormal results are displayed) Labs Reviewed  COMPREHENSIVE METABOLIC PANEL - Abnormal; Notable for the following:       Result Value   BUN 36 (*)    Creatinine, Ser 1.71 (*)    Albumin 3.3 (*)    ALT 8 (*)    GFR calc non Af Amer 34 (*)    GFR calc Af Amer 40 (*)    All other components within normal limits  CBC WITH DIFFERENTIAL/PLATELET - Abnormal; Notable for the following:    RBC 3.59 (*)    Hemoglobin 10.7 (*)    HCT 33.0 (*)    Monocytes Absolute 1.3 (*)    All other components within normal limits  URINALYSIS, ROUTINE W REFLEX MICROSCOPIC (NOT AT [facility]) - Abnormal; Notable for the following:    Bilirubin Urine SMALL (*)    All other components within  normal limits  APTT - Abnormal; Notable for the following:    aPTT 42 (*)    All other components within normal limits  D-DIMER, QUANTITATIVE (NOT AT [facility]) - Abnormal; Notable for the following:    D-Dimer, Quant 1.12 (*)    All other components within normal limits  TROPONIN I - Abnormal; Notable for the following:    Troponin I 0.03 (*)    All other components within normal limits  PROTIME-INR    EKG  EKG Interpretation None       Radiology Dg Neck Soft Tissue  Result Date: 10/20/2015 CLINICAL DATA:  Acute onset of neck bruising.  Initial encounter. EXAM: NECK SOFT TISSUES - 1+ VIEW COMPARISON:  Cervical spine radiographs performed earlier today at 8:10 p.m. FINDINGS: The nasopharynx, oropharynx and hypopharynx are grossly unremarkable. The epiglottis is difficult to fully assess but appears grossly unremarkable. Prevertebral soft tissues are within normal limits. There is mild grade 1 anterolisthesis of C6 on C7. The visualized paranasal sinuses and mastoid air cells are well-aerated. The visualized lung apices are grossly clear. IMPRESSION: Unremarkable radiographs of the soft tissues of the neck, aside from minimal degenerative change at the lower cervical spine. Electronically Signed   By: Garald Balding M.D.   On: 10/20/2015 21:48   Dg Chest 2 View  Result Date: 10/20/2015 CLINICAL DATA:  Acute onset of neck bruising and shortness of breath. Initial encounter. EXAM: CHEST  2 VIEW COMPARISON:  Chest radiograph performed 10/31/2012 FINDINGS: The lungs are well-aerated. Mild vascular congestion noted. Mild bibasilar opacities may reflect atelectasis or possibly mild infection. There is no evidence of pleural effusion or pneumothorax. The heart is normal in size; the mediastinal contour is within normal limits. No acute osseous abnormalities are seen. A moderate hiatal hernia is noted. IMPRESSION: 1. Mild bibasilar opacities may reflect atelectasis or possibly mild infection. 2. Mild  vascular congestion noted. 3. Moderate hiatal hernia seen. Electronically Signed   By: Garald Balding M.D.   On: 10/20/2015 21:38   Dg Cervical Spine Complete  Result Date: 10/20/2015 CLINICAL DATA:  Acute onset of neck bruising.  Initial encounter. EXAM: CERVICAL SPINE - COMPLETE 4+ VIEW COMPARISON:  None. FINDINGS: There is no evidence of acute fracture or subluxation. There is mild grade 1 anterolisthesis of C6 on C7, and multilevel disc space narrowing is noted along the lower cervical spine, with mild underlying facet disease. Vertebral bodies demonstrate normal height.  Prevertebral soft tissues are within normal limits. The provided odontoid view demonstrates no significant abnormality. The visualized lung apices are clear. IMPRESSION: No evidence of acute fracture or subluxation along the cervical spine. Mild degenerative change at the lower cervical spine. Electronically Signed   By: Garald Balding M.D.   On: 10/20/2015 21:51    Procedures Procedures (including critical care time)  Medications Ordered in ED Medications  sodium chloride 0.9 % bolus 1,000 mL (0 mLs Intravenous Stopped 10/20/15 2128)  iopamidol (ISOVUE-370) 76 % injection 100 mL (71.7 mLs Intravenous Contrast Given 10/20/15 2134)     Initial Impression / Assessment and Plan / ED Course  I have reviewed the triage vital signs and the nursing notes.  Pertinent labs & imaging results that were available during my care of the patient were reviewed by me and considered in my medical decision making (see chart for details).  Clinical Course  Patient presents to the ED sent by pcp.  Patient's hemoglobin was noted to be 10.7 today. Hemoglobin drawn yesterday was 12.1. Patient was noted to have a white count yesterday by PCP at 13.9. However in ED today patient without leukocytosis. Chest xray shows mild bibasilar opacities which may reflect atelectasis or possible infection. D-dimer was noted to be elevated. Elevated troponin. EKG  without acute changes reviewed by Dr. Sherry Ruffing. Patient's creatinine was noted be 1.7. Do not know baseline creatinine. Patient with history of CKD stage III. We will obtain a CTA to rule out PE due to patient's chest pain and short of breath yesterday. Denies any cp or sob at this time. UA is unremarkable.  PTT is elevated. Patient states he is not on blood thinner. PT and INR is normal. Vital signs stable. No tachycardia and patient is afebrile. Patient will likely be admitted to the hospital. Patient will be seen and evaluated by Dr. Sherry Ruffing. CTA shows no PE but bibasilar opacities concerning for pna. Will treat for CAP. Patient likely to be admitted to the hospital. Care hand off to Dr. Sherry Ruffing.  Final Clinical Impressions(s) / ED Diagnoses   Final diagnoses:  None    New Prescriptions New Prescriptions   No medications on file     Doristine Devoid, PA-C 10/20/15 [place], MD 10/21/15 1137

## 2015-10-21 ENCOUNTER — Encounter (HOSPITAL_COMMUNITY): Payer: Self-pay | Admitting: Family Medicine

## 2015-10-21 DIAGNOSIS — N183 Chronic kidney disease, stage 3 (moderate): Secondary | ICD-10-CM

## 2015-10-21 DIAGNOSIS — E43 Unspecified severe protein-calorie malnutrition: Secondary | ICD-10-CM | POA: Insufficient documentation

## 2015-10-21 DIAGNOSIS — J189 Pneumonia, unspecified organism: Secondary | ICD-10-CM | POA: Diagnosis not present

## 2015-10-21 DIAGNOSIS — S1093XA Contusion of unspecified part of neck, initial encounter: Secondary | ICD-10-CM | POA: Diagnosis not present

## 2015-10-21 DIAGNOSIS — T148 Other injury of unspecified body region: Secondary | ICD-10-CM

## 2015-10-21 DIAGNOSIS — I10 Essential (primary) hypertension: Secondary | ICD-10-CM

## 2015-10-21 DIAGNOSIS — J470 Bronchiectasis with acute lower respiratory infection: Secondary | ICD-10-CM | POA: Diagnosis not present

## 2015-10-21 DIAGNOSIS — R079 Chest pain, unspecified: Secondary | ICD-10-CM | POA: Diagnosis not present

## 2015-10-21 LAB — BASIC METABOLIC PANEL
Anion gap: 7 (ref 5–15)
BUN: 29 mg/dL — AB (ref 6–20)
CALCIUM: 8.6 mg/dL — AB (ref 8.9–10.3)
CO2: 22 mmol/L (ref 22–32)
Chloride: 110 mmol/L (ref 101–111)
Creatinine, Ser: 1.39 mg/dL — ABNORMAL HIGH (ref 0.61–1.24)
GFR calc Af Amer: 51 mL/min — ABNORMAL LOW (ref >60)
GFR, EST NON AFRICAN AMERICAN: 44 mL/min — AB (ref >60)
GLUCOSE: 97 mg/dL (ref 65–99)
Potassium: 4 mmol/L (ref 3.5–5.1)
Sodium: 139 mmol/L (ref 135–145)

## 2015-10-21 LAB — CBC
HCT: 29.8 % — ABNORMAL LOW (ref 39.0–52.0)
Hemoglobin: 9.9 g/dL — ABNORMAL LOW (ref 13.0–17.0)
MCH: 30.5 pg (ref 26.0–34.0)
MCHC: 33.2 g/dL (ref 30.0–36.0)
MCV: 91.7 fL (ref 78.0–100.0)
Platelets: 263 10*3/uL (ref 150–400)
RBC: 3.25 MIL/uL — ABNORMAL LOW (ref 4.22–5.81)
RDW: 13.4 % (ref 11.5–15.5)
WBC: 9 10*3/uL (ref 4.0–10.5)

## 2015-10-21 LAB — STREP PNEUMONIAE URINARY ANTIGEN: STREP PNEUMO URINARY ANTIGEN: NEGATIVE

## 2015-10-21 LAB — TROPONIN I

## 2015-10-21 LAB — HIV ANTIBODY (ROUTINE TESTING W REFLEX): HIV Screen 4th Generation wRfx: NONREACTIVE

## 2015-10-21 LAB — BRAIN NATRIURETIC PEPTIDE: B NATRIURETIC PEPTIDE 5: 87.8 pg/mL (ref 0.0–100.0)

## 2015-10-21 MED ORDER — ORAL CARE MOUTH RINSE
15.0000 mL | Freq: Two times a day (BID) | OROMUCOSAL | Status: DC
  Administered 2015-10-21 – 2015-10-22 (×3): 15 mL via OROMUCOSAL

## 2015-10-21 MED ORDER — ENSURE ENLIVE PO LIQD
237.0000 mL | Freq: Two times a day (BID) | ORAL | Status: DC
  Administered 2015-10-21 – 2015-10-22 (×4): 237 mL via ORAL

## 2015-10-21 MED ORDER — NITROGLYCERIN 0.4 MG SL SUBL: 0.4000 mg | SUBLINGUAL_TABLET | SUBLINGUAL | Status: DC | PRN

## 2015-10-21 MED ORDER — AZITHROMYCIN 500 MG IV SOLR
500.0000 mg | INTRAVENOUS | Status: DC
  Administered 2015-10-22: 500 mg via INTRAVENOUS
  Filled 2015-10-21: qty 500

## 2015-10-21 MED ORDER — SIMVASTATIN 20 MG PO TABS
20.0000 mg | ORAL_TABLET | Freq: Every day | ORAL | Status: DC
  Administered 2015-10-21 (×2): 20 mg via ORAL
  Filled 2015-10-21 (×2): qty 1

## 2015-10-21 MED ORDER — DEXTROSE 5 % IV SOLN
1.0000 g | INTRAVENOUS | Status: DC
  Administered 2015-10-21: 1 g via INTRAVENOUS
  Filled 2015-10-21: qty 10

## 2015-10-21 MED ORDER — ONDANSETRON HCL 4 MG/2ML IJ SOLN: 4.0000 mg | Freq: Four times a day (QID) | INTRAMUSCULAR | Status: DC | PRN

## 2015-10-21 MED ORDER — METOPROLOL TARTRATE 25 MG PO TABS
12.5000 mg | ORAL_TABLET | Freq: Two times a day (BID) | ORAL | Status: DC
  Administered 2015-10-21 – 2015-10-22 (×4): 12.5 mg via ORAL
  Filled 2015-10-21 (×5): qty 1

## 2015-10-21 MED ORDER — INFLUENZA VAC SPLIT QUAD 0.5 ML IM SUSY
0.5000 mL | PREFILLED_SYRINGE | INTRAMUSCULAR | Status: AC
  Administered 2015-10-22: 0.5 mL via INTRAMUSCULAR
  Filled 2015-10-21: qty 0.5

## 2015-10-21 MED ORDER — TAMSULOSIN HCL 0.4 MG PO CAPS
0.4000 mg | ORAL_CAPSULE | Freq: Every day | ORAL | Status: DC
  Administered 2015-10-21 – 2015-10-22 (×2): 0.4 mg via ORAL
  Filled 2015-10-21 (×2): qty 1

## 2015-10-21 MED ORDER — ACETAMINOPHEN 500 MG PO TABS
1000.0000 mg | ORAL_TABLET | Freq: Four times a day (QID) | ORAL | Status: DC | PRN
  Administered 2015-10-21 (×2): 1000 mg via ORAL
  Filled 2015-10-21 (×2): qty 2

## 2015-10-21 NOTE — H&P (Signed)
History and Physical    QUISHAWN Anderson [id number] DOB: December 09, 1926 DOA: 10/20/2015  PCP: Kennyth Arnold, FNP   Patient coming from: Home  Chief Complaint: Chest pain, SOB, chills   HPI: Sergio Anderson is a 82 y.o. male with medical history significant for hypertension, hyperlipidemia, chronic kidney disease stage III, and chronic normocytic anemia who presents to the emergency department for evaluation of chest pain, dyspnea, chills, and easy bruising. Patient reports that he was in his usual state of health until awakening the morning of 10/18/2015 with extensive bruising about the anterior neck. There is some mild pain associated with this, but the patient denied any preceding trauma or injury. Over the next couple days, patient developed chills with mild dyspnea and then central chest pain yesterday. Patient reports the chest pain to be localized to the central chest, associated with dyspnea, but no nausea or diaphoresis, mild in intensity, aching in character, and spontaneously resolving over the course of several minutes. Patient denies any fevers or chills, denies abdominal pain, nausea, or vomiting, and denies melena or hematochezia. He endorses some mild neck pain, but denies photophobia or phonophobia, headache, change in vision or hearing, focal numbness or weakness.  ED Course: Upon arrival to the ED, patient is found to be afebrile, saturating well on room air, and with vital signs stable. EKG demonstrates sinus rhythm with incomplete right bundle branch block which is not significantly changed from prior. Chest x-ray is notable for mild pulmonary vascular congestion and bibasilar opacities. Chemistry panel features a serum creatinine 1.71 which is consistent with his apparent baseline. CBC is notable for a stable normocytic anemia with hemoglobin of 10.7. Troponin is mildly elevated to 0.03, urinalysis is unremarkable, and INR is within normal limits. D-dimer was obtained, is  elevated to 1.1, and was followed by CTA PE study. CTA is negative for PE, but notable for patchy focal bibasilar opacities concerning for atypical infection. Patient was given 1 L of normal saline in the emergency department and treated with empiric Rocephin and azithromycin. He remained hemodynamically stable and in no significant restaurant distress. He'll be observed on the telemetry unit for ongoing evaluation and management of chest pain and possible CAP.  Review of Systems:  All other systems reviewed and apart from HPI, are negative.  Past Medical History:  Diagnosis Date  . Anemia 12/23/2011  . Ascending aorta dilatation (HCC) 09/02/2011  . BACK PAIN, LUMBAR, WITH RADICULOPATHY 04/01/2010  . Bruising 07/30/2012  . CATARACT EXTRACTIONS, BILATERAL, HX OF 03/01/2010  . COLONIC POLYPS, HX OF 03/01/2010  . Dermatitis 03/30/2011  . ELEVATED BP READING WITHOUT DX HYPERTENSION 03/01/2010  . Fatigue 05/03/2011  . HTN (hypertension) 10/05/2010  . HYPERLIPIDEMIA 03/01/2010  . Nocturia 03/01/2010  . PROSTATE CANCER 03/01/2010  . PROSTATITIS, ACUTE 04/01/2010  . PUD, HX OF 03/01/2010  . Pulmonary nodule 09/14/2010  . Sinusitis 03/30/2011  . SKIN CANCER, HX OF 03/01/2010  . SOB (shortness of breath) 09/04/2011  . Tremor 12/23/2011    Past Surgical History:  Procedure Laterality Date  . ABDOMINAL SURGERY  1960   Correction of PUD and hiatal hernia  . COLONOSCOPY W/ BIOPSIES AND POLYPECTOMY  1980's   several, benign  . Mose surgery to scalp    . TONSILLECTOMY AND ADENOIDECTOMY       reports that he has never smoked. He has never used smokeless tobacco. He reports that he does not drink alcohol or use drugs.  Allergies  Allergen Reactions  . Vioxx [Rofecoxib]  Swelling    Family History  Problem Relation Age of Onset  . Stroke Mother   . Hip fracture Mother   . Lung disease Father     smoker  . Cancer Sister     lung/ smoker  . Heart disease Brother   . Heart attack Brother   . Alcohol abuse Maternal  Grandfather   . Cancer Paternal Grandmother     colon  . Pneumonia Brother   . Alcohol abuse Brother   . Achalasia Son      Prior to Admission medications   Medication Sig Start Date End Date Taking? Authorizing Provider  acetaminophen (TYLENOL) 500 MG tablet Take 1,000 mg by mouth every 6 (six) hours as needed for mild pain.   Yes Historical Provider, MD  metoprolol tartrate (LOPRESSOR) 25 MG tablet Take 12.5 mg by mouth 2 (two) times daily.  10/23/12  Yes Historical Provider, MD  simvastatin (ZOCOR) 20 MG tablet TAKE 1 TABLET BY MOUTH EVERY NIGHT AT BEDTIME 09/14/12  Yes Mosie Lukes, MD  tamsulosin (FLOMAX) 0.4 MG CAPS capsule TAKE ONE CAPSULE BY MOUTH EVERY DAY 04/23/13  Yes Kennyth Arnold, FNP  Clobetasol Propionate 0.05 % lotion Apply 1 application topically at bedtime. Applied to lower back. Patient not taking: Reported on 10/20/2015 12/17/12   Kennyth Arnold, FNP  warfarin (COUMADIN) 2.5 MG tablet Take as directed Patient not taking: Reported on 10/20/2015 04/08/13   Kennyth Arnold, FNP    Physical Exam: Vitals:   10/20/15 2039 10/20/15 2100 10/20/15 2316 10/21/15 0000  BP: 164/74 156/60 178/77 (!) 185/75  Pulse: (!) 59 (!) 58 67 71  Resp: 17 18 17 18   Temp:    97.8 F (36.6 C)  TempSrc:    Oral  SpO2: 96% 98% 99% 98%  Weight:    59.4 kg (130 lb 14.4 oz)  Height:    5\' 8"  (1.727 m)      Constitutional: NAD, calm, comfortable Eyes: PERTLA, lids and conjunctivae normal ENMT: Mucous membranes are moist. Posterior pharynx clear of any exudate or lesions.   Neck: Extensive ecchymosis about the anterior neck without tenderness or limitation in ROM. Supple, no masses, no thyromegaly Respiratory: Rhonchi at both bases. Normal respiratory effort. No accessory muscle use.  Cardiovascular: S1 & S2 heard, regular rate and rhythm. No extremity edema. No significant JVD. Abdomen: No distension, no tenderness, no masses palpated. Bowel sounds normal.  Musculoskeletal: no clubbing /  cyanosis. No joint deformity upper and lower extremities. Normal muscle tone.  Skin: no significant rashes, lesions, ulcers. Warm, dry, well-perfused. Neurologic: CN 2-12 grossly intact. Sensation intact, DTR normal. Strength 5/5 in all 4 limbs.  Psychiatric: Normal judgment and insight. Alert and oriented x 3. Normal mood and affect.     Labs on Admission: I have personally reviewed following labs and imaging studies  CBC:  Recent Labs Lab 10/20/15 1934  WBC 9.5  NEUTROABS 6.0  HGB 10.7*  HCT 33.0*  MCV 91.9  PLT 0000000   Basic Metabolic Panel:  Recent Labs Lab 10/20/15 1934  NA 139  K 4.3  CL 107  CO2 23  GLUCOSE 96  BUN 36*  CREATININE 1.71*  CALCIUM 9.1   GFR: Estimated Creatinine Clearance: 25.6 mL/min (by C-G formula based on SCr of 1.71 mg/dL (H)). Liver Function Tests:  Recent Labs Lab 10/20/15 1934  AST 15  ALT 8*  ALKPHOS 56  BILITOT 0.8  PROT 6.9  ALBUMIN 3.3*   No results for  input(s): LIPASE, AMYLASE in the last 168 hours. No results for input(s): AMMONIA in the last 168 hours. Coagulation Profile:  Recent Labs Lab 10/20/15 1934  INR 1.15   Cardiac Enzymes:  Recent Labs Lab 10/20/15 1934  TROPONINI 0.03*   BNP (last 3 results) No results for input(s): PROBNP in the last 8760 hours. HbA1C: No results for input(s): HGBA1C in the last 72 hours. CBG: No results for input(s): GLUCAP in the last 168 hours. Lipid Profile: No results for input(s): CHOL, HDL, LDLCALC, TRIG, CHOLHDL, LDLDIRECT in the last 72 hours. Thyroid Function Tests: No results for input(s): TSH, T4TOTAL, FREET4, T3FREE, THYROIDAB in the last 72 hours. Anemia Panel: No results for input(s): VITAMINB12, FOLATE, FERRITIN, TIBC, IRON, RETICCTPCT in the last 72 hours. Urine analysis:    Component Value Date/Time   COLORURINE YELLOW 10/20/2015 1920   APPEARANCEUR CLEAR 10/20/2015 1920   LABSPEC 1.019 10/20/2015 1920   PHURINE 6.0 10/20/2015 1920   GLUCOSEU NEGATIVE  10/20/2015 1920   HGBUR NEGATIVE 10/20/2015 1920   BILIRUBINUR SMALL (A) 10/20/2015 1920   KETONESUR NEGATIVE 10/20/2015 1920   PROTEINUR NEGATIVE 10/20/2015 1920   UROBILINOGEN 1.0 09/06/2012 1036   NITRITE NEGATIVE 10/20/2015 1920   LEUKOCYTESUR NEGATIVE 10/20/2015 1920   Sepsis Labs: @LABRCNTIP (procalcitonin:4,lacticidven:4) )No results found for this or any previous visit (from the past 240 hour(s)).   Radiological Exams on Admission: Dg Neck Soft Tissue  Result Date: 10/20/2015 CLINICAL DATA:  Acute onset of neck bruising.  Initial encounter. EXAM: NECK SOFT TISSUES - 1+ VIEW COMPARISON:  Cervical spine radiographs performed earlier today at 8:10 p.m. FINDINGS: The nasopharynx, oropharynx and hypopharynx are grossly unremarkable. The epiglottis is difficult to fully assess but appears grossly unremarkable. Prevertebral soft tissues are within normal limits. There is mild grade 1 anterolisthesis of C6 on C7. The visualized paranasal sinuses and mastoid air cells are well-aerated. The visualized lung apices are grossly clear. IMPRESSION: Unremarkable radiographs of the soft tissues of the neck, aside from minimal degenerative change at the lower cervical spine. Electronically Signed   By: Garald Balding M.D.   On: 10/20/2015 21:48   Dg Chest 2 View  Result Date: 10/20/2015 CLINICAL DATA:  Acute onset of neck bruising and shortness of breath. Initial encounter. EXAM: CHEST  2 VIEW COMPARISON:  Chest radiograph performed 10/31/2012 FINDINGS: The lungs are well-aerated. Mild vascular congestion noted. Mild bibasilar opacities may reflect atelectasis or possibly mild infection. There is no evidence of pleural effusion or pneumothorax. The heart is normal in size; the mediastinal contour is within normal limits. No acute osseous abnormalities are seen. A moderate hiatal hernia is noted. IMPRESSION: 1. Mild bibasilar opacities may reflect atelectasis or possibly mild infection. 2. Mild vascular  congestion noted. 3. Moderate hiatal hernia seen. Electronically Signed   By: Garald Balding M.D.   On: 10/20/2015 21:38   Dg Cervical Spine Complete  Result Date: 10/20/2015 CLINICAL DATA:  Acute onset of neck bruising.  Initial encounter. EXAM: CERVICAL SPINE - COMPLETE 4+ VIEW COMPARISON:  None. FINDINGS: There is no evidence of acute fracture or subluxation. There is mild grade 1 anterolisthesis of C6 on C7, and multilevel disc space narrowing is noted along the lower cervical spine, with mild underlying facet disease. Vertebral bodies demonstrate normal height. Prevertebral soft tissues are within normal limits. The provided odontoid view demonstrates no significant abnormality. The visualized lung apices are clear. IMPRESSION: No evidence of acute fracture or subluxation along the cervical spine. Mild degenerative change at the lower  cervical spine. Electronically Signed   By: Garald Balding M.D.   On: 10/20/2015 21:51   Ct Angio Chest Pe W/cm &/or Wo Cm  Result Date: 10/20/2015 CLINICAL DATA:  Acute onset of neck bruising. Question of septicemia. Assess for pulmonary emboli. Initial encounter. EXAM: CT ANGIOGRAPHY CHEST WITH CONTRAST TECHNIQUE: Multidetector CT imaging of the chest was performed using the standard protocol during bolus administration of intravenous contrast. Multiplanar CT image reconstructions and MIPs were obtained to evaluate the vascular anatomy. CONTRAST:  71.7 mL of Isovue 370 IV contrast COMPARISON:  CTA of the chest performed 09/05/2012, and chest radiograph performed earlier today at 8:27 p.m. FINDINGS: Cardiovascular: There is no evidence of pulmonary embolus. The heart is mildly enlarged. Scattered coronary artery calcifications are seen. Scattered calcification is noted along the thoracic aorta, with minimal mural thrombus at the distal aortic arch. The great vessels are grossly unremarkable in appearance. Mediastinum/Nodes: A moderate hiatal hernia is noted. Visualized  mediastinal nodes remain borderline normal in size. No pericardial effusion is identified. The thyroid gland is unremarkable in appearance. No axillary lymphadenopathy is seen. Lungs/Pleura: Patchy focal bibasilar airspace opacities are seen, worse on the left, with underlying significant bronchiectasis and mild diffuse right basilar nodularity. This raises concern for an underlying atypical infectious process. Trace left-sided pleural fluid is noted.  No pneumothorax is seen. Upper Abdomen: The visualized portions of the liver and spleen are grossly unremarkable. A 3.0 cm cyst is noted at the interpole region of the left kidney. Musculoskeletal: No acute osseous abnormalities are identified. The visualized musculature is unremarkable in appearance. Review of the MIP images confirms the above findings. IMPRESSION: 1. No evidence of pulmonary embolus.  No evidence of septic emboli. 2. Patchy focal bibasilar airspace opacities, worse on the left, with underlying significant bronchiectasis and mild diffuse right basilar nodularity. This raises concern for an underlying atypical infectious process. 3. Mild cardiomegaly. 4. Scattered coronary artery calcifications seen. 5. Trace left-sided pleural fluid noted. 6. Left renal cyst seen. Electronically Signed   By: Garald Balding M.D.   On: 10/20/2015 22:01    EKG: Independently reviewed. Sinus rhythm, incomplete RBBB, not significantly changed from prior  Assessment/Plan  1. Chest pain - No angina since arrival in the ED  - Initial EKG with incomplete RBBB, not significantly changed from prior; initial troponin 0.03  - PE ruled-out with CTA  - ASA precluded by reported allergy  - Monitor on telemetry for ischemic changes, trend troponin, repeat EKG in am  - Continue Lopressor and statin     2. CAP  - Pt afebrile, in no respiratory distress, and without leukocytosis, but does note SOB and has rhonchi at the bases  - CTA findings suggest atypical infection    - Started on Rocephin and azithromycin in ED, will continue   3. CKD stage III  - SCr 1.71 on admission, appears to be consistent with his baseline - Received IV contrast in ED for CTA, received 1 L NS in ED  - Avoid nephrotoxins, repeat chem panel in am   4. Normocytic anemia  - Hgb 10.7 on admission; baseline seems to be 10-12  - There is large ecchymosis around anterior neck, but pt denies melena or hematochezia; monitor   5. Easy bruising - Pt reports hx of easy bruising, but used to be on coumadin  - He now has extensive ecchymosis about the neck that he believes came from scratching self in his sleep  - INR is 1.15; APTT is  mildly elevated at 42, stable from 2014 - Repeat CBC in am    DVT prophylaxis: SCD's  Code Status: Full  Family Communication: Discussed with patient Disposition Plan: Observe on telemetry Consults called: None Admission status: Observation    Vianne Bulls, MD [facility] Pager [phone]  If 7PM-7AM, please contact night-coverage [email or web address] Password [email or web address]  10/21/2015, 12:16 AM

## 2015-10-21 NOTE — Progress Notes (Signed)
Initial Nutrition Assessment  DOCUMENTATION CODES:   Severe malnutrition in context of chronic illness  INTERVENTION:  Continue Ensure Enlive po BID, each supplement provides 350 kcal and 20 grams of protein. Discussed he should continue this for at least 1 month.   NUTRITION DIAGNOSIS:   Inadequate oral intake related to other (see comment), poor appetite (busy helping with care of wife) as evidenced by per patient/family report.  GOAL:   Patient will meet greater than or equal to 90% of their needs  MONITOR:   PO intake, Supplement acceptance, I & O's, Labs, Weight trends  REASON FOR ASSESSMENT:   Malnutrition Screening Tool    ASSESSMENT:   82 y.o. male with medical history significant for hypertension, hyperlipidemia, chronic kidney disease stage III, and chronic normocytic anemia who presents to the emergency department for evaluation of chest pain, dyspnea, chills, and easy bruising.   Patient reports his UBW is 150 lbs and that he has lost 20 lbs in the past 2 months. Although current weight of 130 lbs reflected in chart, patient has not weighed 150 lbs since 2013 per chart. He reports that in the past few weeks he has not been able to eat much because his wife requires a high level of care from him. He reports his appetite is good now and he has been finishing meals while here.   Meal Completion: 100% per patient (lunch tray still in room and patient had finished everything)  Medications reviewed and include non pertinent. Pt already ordered for Ensure Enlive BID.  Labs reviewed: BUN 29, Creatinine 1.39.  Nutrition-Focused physical exam completed. Findings are severe fat depletion, severe muscle depletion, and no edema.   Diet Order:  Diet Heart Room service appropriate? Yes; Fluid consistency: Thin  Skin:  Reviewed, no issues  Last BM:  PTA  Height:   Ht Readings from Last 1 Encounters:  10/21/15 5\' 8"  (1.727 m)    Weight:   Wt Readings from Last 1  Encounters:  10/21/15 130 lb 14.4 oz (59.4 kg)    Ideal Body Weight:  70 kg  BMI:  Body mass index is 19.9 kg/m.  Estimated Nutritional Needs:   Kcal:  1500-2000  Protein:  47-55 grams  Fluid:  1.5-2 L/day or per team  EDUCATION NEEDS:   Education needs addressed  Willey Blade, MS, RD, LDN Pager: [phone] After Hours Pager: [phone]

## 2015-10-21 NOTE — Progress Notes (Signed)
Sergio Anderson is a 83 y.o. male with medical history significant for hypertension, hyperlipidemia, chronic kidney disease stage III, and chronic normocytic anemia who presents to the emergency department for evaluation of chest pain, dyspnea, chills, and easy bruising. His CT angio showed CAP, he was started on azithromycin and rocephin. He reports feeling better. Continue to monitor.   Hosie Poisson MD [phone]

## 2015-10-22 DIAGNOSIS — E43 Unspecified severe protein-calorie malnutrition: Secondary | ICD-10-CM | POA: Diagnosis not present

## 2015-10-22 DIAGNOSIS — R079 Chest pain, unspecified: Secondary | ICD-10-CM | POA: Diagnosis not present

## 2015-10-22 DIAGNOSIS — J189 Pneumonia, unspecified organism: Secondary | ICD-10-CM | POA: Diagnosis not present

## 2015-10-22 LAB — LEGIONELLA PNEUMOPHILA SEROGP 1 UR AG: L. pneumophila Serogp 1 Ur Ag: NEGATIVE

## 2015-10-22 MED ORDER — ENSURE ENLIVE PO LIQD: 237.0000 mL | Freq: Two times a day (BID) | ORAL | 12 refills | Status: AC

## 2015-10-22 MED ORDER — LEVOFLOXACIN 750 MG PO TABS: 750.0000 mg | ORAL_TABLET | Freq: Every day | ORAL | 0 refills | Status: AC

## 2015-10-22 NOTE — Care Management Obs Status (Signed)
[place] NOTIFICATION   Patient Details  Name: Sergio Anderson MRN: [id number] Date of Birth: 04-14-1926   Medicare Observation Status Notification Given:  Yes    McGibboneyOletta Darter, RN 10/22/2015, 1:40 PM

## 2015-10-23 NOTE — Progress Notes (Signed)
Pt declined HH. States that he take care of his wife at home and his son is with him right now to help.  There are no needs at present time.

## 2015-10-26 NOTE — Discharge Summary (Signed)
Physician Discharge Summary  Sergio Anderson [id number] DOB: 1926/07/19 DOA: 10/20/2015  PCP: Kennyth Arnold, FNP  Admit date: 10/20/2015 Discharge date: 10/22/2015  Admitted From: [facility] Disposition:  Home  Recommendations for Outpatient Follow-up:  1. Follow up with PCP in 1-2 weeks 2. Please obtain BMP/CBC in one week   Home Health:yes   Discharge Condition:stable.  CODE STATUS:(FULL Diet recommendation: Heart Healthy /  Brief/Interim Summary: Sergio Anderson a 82 y.o.malewith medical history significant for hypertension,hyperlipidemia, chronic kidney disease stage III, and chronic normocytic anemia who presents to the emergency department for evaluation of chest pain, dyspnea, chills, and easy bruising. His CT angio showed CAP, he was started on azithromycin and rocephin. He reports feeling better, wants to be discharged as soon as possible so he can take care of his wife who is on hospice at home.   Discharge Diagnoses:  Principal Problem:   Chest pain Active Problems:   PROSTATE CANCER   PUD, HX OF   HTN (hypertension)   Bruising   CAP (community acquired pneumonia)   CKD (chronic kidney disease), stage III   Community acquired pneumonia   Protein-calorie malnutrition, severe  1. Chest pain - No angina since arrival in the ED  - Initial EKG with incomplete RBBB, not significantly changed from prior; initial troponin 0.03  - PE ruled-out with CTA  - ASA precluded by reported allergy  - Continue Lopressor and statin    - BNP normal. Serial troponins are negative and EKG does not show any ischemic changes.  - chest pain resolved.   2. CAP  - Pt afebrile, in no respiratory distress, and without leukocytosis, but does note SOB and has rhonchi at the bases  - CTA findings suggest atypical infection  - Started on Rocephin and azithromycin in ED, discharged on levaquin .   3. CKD stage III  - SCr 1.71 on admission, appears to be consistent with his  baseline - Received IV contrast in ED for CTA, received 1 L NS in ED  - Avoid nephrotoxins, repeat chem panel shows improvement.  4. Normocytic anemia  - Hgb 10.7 on admission; baseline seems to be 10-12  - There is large ecchymosis around anterior neck, but pt denies melena or hematochezia;  5. Easy bruising - Pt reports hx of easy bruising, but used to be on coumadin  - He now has extensive ecchymosis about the neck that he believes came from scratching self in his sleep  - INR is 1.15; APTT is mildly elevated at 42, stable from 2014 - Repeat CBC in am around 10 and stable.   Discharge Instructions  Discharge Instructions    Discharge instructions    Complete by:  As directed    Follow up with PCP in one week.       Medication List    STOP taking these medications   warfarin 2.5 MG tablet Commonly known as:  COUMADIN     TAKE these medications   acetaminophen 500 MG tablet Commonly known as:  TYLENOL Take 1,000 mg by mouth every 6 (six) hours as needed for mild pain.   Clobetasol Propionate 0.05 % lotion Apply 1 application topically at bedtime. Applied to lower back.   feeding supplement (ENSURE ENLIVE) Liqd Take 237 mLs by mouth 2 (two) times daily between meals.   levofloxacin 750 MG tablet Commonly known as:  LEVAQUIN Take 1 tablet (750 mg total) by mouth daily.   metoprolol tartrate 25 MG tablet Commonly known as:  LOPRESSOR Take 12.5 mg by mouth 2 (two) times daily.   simvastatin 20 MG tablet Commonly known as:  ZOCOR TAKE 1 TABLET BY MOUTH EVERY NIGHT AT BEDTIME   tamsulosin 0.4 MG Caps capsule Commonly known as:  FLOMAX TAKE ONE CAPSULE BY MOUTH EVERY DAY      Follow-up Information    Kennyth Arnold, FNP. Schedule an appointment as soon as possible for a visit in 1 week(s).   Specialty:  Family Medicine Contact information: [place] [address] [phone]          Allergies  Allergen Reactions  . Vioxx [Rofecoxib]  Swelling    Consultations: None   Procedures/Studies: Dg Neck Soft Tissue  Result Date: 10/20/2015 CLINICAL DATA:  Acute onset of neck bruising.  Initial encounter. EXAM: NECK SOFT TISSUES - 1+ VIEW COMPARISON:  Cervical spine radiographs performed earlier today at 8:10 p.m. FINDINGS: The nasopharynx, oropharynx and hypopharynx are grossly unremarkable. The epiglottis is difficult to fully assess but appears grossly unremarkable. Prevertebral soft tissues are within normal limits. There is mild grade 1 anterolisthesis of C6 on C7. The visualized paranasal sinuses and mastoid air cells are well-aerated. The visualized lung apices are grossly clear. IMPRESSION: Unremarkable radiographs of the soft tissues of the neck, aside from minimal degenerative change at the lower cervical spine. Electronically Signed   By: Garald Balding M.D.   On: 10/20/2015 21:48   Dg Chest 2 View  Result Date: 10/20/2015 CLINICAL DATA:  Acute onset of neck bruising and shortness of breath. Initial encounter. EXAM: CHEST  2 VIEW COMPARISON:  Chest radiograph performed 10/31/2012 FINDINGS: The lungs are well-aerated. Mild vascular congestion noted. Mild bibasilar opacities may reflect atelectasis or possibly mild infection. There is no evidence of pleural effusion or pneumothorax. The heart is normal in size; the mediastinal contour is within normal limits. No acute osseous abnormalities are seen. A moderate hiatal hernia is noted. IMPRESSION: 1. Mild bibasilar opacities may reflect atelectasis or possibly mild infection. 2. Mild vascular congestion noted. 3. Moderate hiatal hernia seen. Electronically Signed   By: Garald Balding M.D.   On: 10/20/2015 21:38   Dg Cervical Spine Complete  Result Date: 10/20/2015 CLINICAL DATA:  Acute onset of neck bruising.  Initial encounter. EXAM: CERVICAL SPINE - COMPLETE 4+ VIEW COMPARISON:  None. FINDINGS: There is no evidence of acute fracture or subluxation. There is mild grade 1  anterolisthesis of C6 on C7, and multilevel disc space narrowing is noted along the lower cervical spine, with mild underlying facet disease. Vertebral bodies demonstrate normal height. Prevertebral soft tissues are within normal limits. The provided odontoid view demonstrates no significant abnormality. The visualized lung apices are clear. IMPRESSION: No evidence of acute fracture or subluxation along the cervical spine. Mild degenerative change at the lower cervical spine. Electronically Signed   By: Garald Balding M.D.   On: 10/20/2015 21:51   Ct Angio Chest Pe W/cm &/or Wo Cm  Result Date: 10/20/2015 CLINICAL DATA:  Acute onset of neck bruising. Question of septicemia. Assess for pulmonary emboli. Initial encounter. EXAM: CT ANGIOGRAPHY CHEST WITH CONTRAST TECHNIQUE: Multidetector CT imaging of the chest was performed using the standard protocol during bolus administration of intravenous contrast. Multiplanar CT image reconstructions and MIPs were obtained to evaluate the vascular anatomy. CONTRAST:  71.7 mL of Isovue 370 IV contrast COMPARISON:  CTA of the chest performed 09/05/2012, and chest radiograph performed earlier today at 8:27 p.m. FINDINGS: Cardiovascular: There is no evidence of pulmonary embolus.  The heart is mildly enlarged. Scattered coronary artery calcifications are seen. Scattered calcification is noted along the thoracic aorta, with minimal mural thrombus at the distal aortic arch. The great vessels are grossly unremarkable in appearance. Mediastinum/Nodes: A moderate hiatal hernia is noted. Visualized mediastinal nodes remain borderline normal in size. No pericardial effusion is identified. The thyroid gland is unremarkable in appearance. No axillary lymphadenopathy is seen. Lungs/Pleura: Patchy focal bibasilar airspace opacities are seen, worse on the left, with underlying significant bronchiectasis and mild diffuse right basilar nodularity. This raises concern for an underlying  atypical infectious process. Trace left-sided pleural fluid is noted.  No pneumothorax is seen. Upper Abdomen: The visualized portions of the liver and spleen are grossly unremarkable. A 3.0 cm cyst is noted at the interpole region of the left kidney. Musculoskeletal: No acute osseous abnormalities are identified. The visualized musculature is unremarkable in appearance. Review of the MIP images confirms the above findings. IMPRESSION: 1. No evidence of pulmonary embolus.  No evidence of septic emboli. 2. Patchy focal bibasilar airspace opacities, worse on the left, with underlying significant bronchiectasis and mild diffuse right basilar nodularity. This raises concern for an underlying atypical infectious process. 3. Mild cardiomegaly. 4. Scattered coronary artery calcifications seen. 5. Trace left-sided pleural fluid noted. 6. Left renal cyst seen. Electronically Signed   By: Garald Balding M.D.   On: 10/20/2015 22:01       Subjective: No new complaints, anxious to go home.   Discharge Exam: Vitals:   10/22/15 0528 10/22/15 1413  BP: (!) 143/63 (!) 139/54  Pulse: 65 63  Resp: 18 18  Temp: 98.6 F (37 C) 98.5 F (36.9 C)   Vitals:   10/21/15 1330 10/21/15 2107 10/22/15 0528 10/22/15 1413  BP: 129/70 136/64 (!) 143/63 (!) 139/54  Pulse: 62 65 65 63  Resp: 18 18 18 18   Temp: 98.7 F (37.1 C) 99.1 F (37.3 C) 98.6 F (37 C) 98.5 F (36.9 C)  TempSrc: Oral Oral Oral Oral  SpO2: 96% 97% 98% 98%  Weight:      Height:        General: Pt is alert, awake, not in acute distress Cardiovascular: RRR, S1/S2 +, no rubs, no gallops Respiratory: CTA bilaterally, no wheezing, no rhonchi Abdominal: Soft, NT, ND, bowel sounds + Extremities: no edema, no cyanosis    The results of significant diagnostics from this hospitalization (including imaging, microbiology, ancillary and laboratory) are listed below for reference.     Microbiology: No results found for this or any previous visit  (from the past 240 hour(s)).   Labs: BNP (last 3 results)  Recent Labs  10/21/15 0113  BNP [id number]   Basic Metabolic Panel:  Recent Labs Lab 10/20/15 1934 10/21/15 0720  NA 139 139  K 4.3 4.0  CL 107 110  CO2 23 22  GLUCOSE 96 97  BUN 36* 29*  CREATININE 1.71* 1.39*  CALCIUM 9.1 8.6*   Liver Function Tests:  Recent Labs Lab 10/20/15 1934  AST 15  ALT 8*  ALKPHOS 56  BILITOT 0.8  PROT 6.9  ALBUMIN 3.3*   No results for input(s): LIPASE, AMYLASE in the last 168 hours. No results for input(s): AMMONIA in the last 168 hours. CBC:  Recent Labs Lab 10/20/15 1934 10/21/15 0720  WBC 9.5 9.0  NEUTROABS 6.0  --   HGB 10.7* 9.9*  HCT 33.0* 29.8*  MCV 91.9 91.7  PLT 293 263   Cardiac Enzymes:  Recent Labs Lab 10/20/15 1934  10/21/15 0113 10/21/15 0720 10/21/15 1331  TROPONINI 0.03* <0.03 <0.03 <0.03   BNP: Invalid input(s): POCBNP CBG: No results for input(s): GLUCAP in the last 168 hours. D-Dimer No results for input(s): DDIMER in the last 72 hours. Hgb A1c No results for input(s): HGBA1C in the last 72 hours. Lipid Profile No results for input(s): CHOL, HDL, LDLCALC, TRIG, CHOLHDL, LDLDIRECT in the last 72 hours. Thyroid function studies No results for input(s): TSH, T4TOTAL, T3FREE, THYROIDAB in the last 72 hours.  Invalid input(s): FREET3 Anemia work up No results for input(s): VITAMINB12, FOLATE, FERRITIN, TIBC, IRON, RETICCTPCT in the last 72 hours. Urinalysis    Component Value Date/Time   COLORURINE YELLOW 10/20/2015 1920   APPEARANCEUR CLEAR 10/20/2015 1920   LABSPEC 1.019 10/20/2015 1920   PHURINE 6.0 10/20/2015 1920   GLUCOSEU NEGATIVE 10/20/2015 1920   HGBUR NEGATIVE 10/20/2015 1920   BILIRUBINUR SMALL (A) 10/20/2015 1920   KETONESUR NEGATIVE 10/20/2015 1920   PROTEINUR NEGATIVE 10/20/2015 1920   UROBILINOGEN 1.0 09/06/2012 1036   NITRITE NEGATIVE 10/20/2015 1920   LEUKOCYTESUR NEGATIVE 10/20/2015 1920   Sepsis Labs Invalid  input(s): PROCALCITONIN,  WBC,  LACTICIDVEN Microbiology No results found for this or any previous visit (from the past 240 hour(s)).   Time coordinating discharge: Over 30 minutes  SIGNED:   Hosie Poisson, MD  [facility] 10/26/2015, 9:29 AM Pager   If 7PM-7AM, please contact night-coverage [email or web address] Password [email or web address]

## 2016-03-14 ENCOUNTER — Emergency Department (HOSPITAL_COMMUNITY): Payer: Medicare Other

## 2016-03-14 ENCOUNTER — Encounter (HOSPITAL_COMMUNITY): Payer: Self-pay

## 2016-03-14 ENCOUNTER — Emergency Department (HOSPITAL_COMMUNITY)
Admission: EM | Admit: 2016-03-14 | Discharge: 2016-03-14 | Disposition: A | Discharge status: Home / Self Care | Payer: Medicare Other | Attending: Emergency Medicine | Admitting: Emergency Medicine

## 2016-03-14 DIAGNOSIS — Z85828 Personal history of other malignant neoplasm of skin: Secondary | ICD-10-CM | POA: Insufficient documentation

## 2016-03-14 DIAGNOSIS — Z79899 Other long term (current) drug therapy: Secondary | ICD-10-CM | POA: Insufficient documentation

## 2016-03-14 DIAGNOSIS — I129 Hypertensive chronic kidney disease with stage 1 through stage 4 chronic kidney disease, or unspecified chronic kidney disease: Secondary | ICD-10-CM | POA: Insufficient documentation

## 2016-03-14 DIAGNOSIS — Z8546 Personal history of malignant neoplasm of prostate: Secondary | ICD-10-CM | POA: Diagnosis not present

## 2016-03-14 DIAGNOSIS — R51 Headache: Secondary | ICD-10-CM | POA: Diagnosis not present

## 2016-03-14 DIAGNOSIS — R519 Headache, unspecified: Secondary | ICD-10-CM

## 2016-03-14 DIAGNOSIS — Z7901 Long term (current) use of anticoagulants: Secondary | ICD-10-CM | POA: Diagnosis not present

## 2016-03-14 DIAGNOSIS — N183 Chronic kidney disease, stage 3 (moderate): Secondary | ICD-10-CM | POA: Diagnosis not present

## 2016-03-14 LAB — BASIC METABOLIC PANEL
ANION GAP: 7 (ref 5–15)
BUN: 35 mg/dL — ABNORMAL HIGH (ref 6–20)
CALCIUM: 9.1 mg/dL (ref 8.9–10.3)
CO2: 26 mmol/L (ref 22–32)
Chloride: 108 mmol/L (ref 101–111)
Creatinine, Ser: 1.66 mg/dL — ABNORMAL HIGH (ref 0.61–1.24)
GFR, EST AFRICAN AMERICAN: 41 mL/min — AB (ref >60)
GFR, EST NON AFRICAN AMERICAN: 35 mL/min — AB (ref >60)
GLUCOSE: 90 mg/dL (ref 65–99)
POTASSIUM: 3.9 mmol/L (ref 3.5–5.1)
Sodium: 141 mmol/L (ref 135–145)

## 2016-03-14 LAB — CBC
HCT: 40.1 % (ref 39.0–52.0)
HEMOGLOBIN: 13.3 g/dL (ref 13.0–17.0)
MCH: 29.4 pg (ref 26.0–34.0)
MCHC: 33.2 g/dL (ref 30.0–36.0)
MCV: 88.5 fL (ref 78.0–100.0)
Platelets: 293 10*3/uL (ref 150–400)
RBC: 4.53 MIL/uL (ref 4.22–5.81)
RDW: 14.3 % (ref 11.5–15.5)
WBC: 11.9 10*3/uL — ABNORMAL HIGH (ref 4.0–10.5)

## 2016-03-14 MED ORDER — ACETAMINOPHEN 500 MG PO TABS
1000.0000 mg | ORAL_TABLET | Freq: Once | ORAL | Status: AC
  Administered 2016-03-14: 1000 mg via ORAL
  Filled 2016-03-14: qty 2

## 2016-03-14 MED ORDER — ASPIRIN 325 MG PO TABS
650.0000 mg | ORAL_TABLET | Freq: Once | ORAL | Status: AC
  Administered 2016-03-14: 650 mg via ORAL
  Filled 2016-03-14: qty 2

## 2016-03-14 MED ORDER — ACETAMINOPHEN 500 MG PO TABS: 1000.0000 mg | ORAL_TABLET | Freq: Four times a day (QID) | ORAL | 0 refills | Status: AC | PRN

## 2016-03-14 NOTE — ED Triage Notes (Addendum)
PT C/O A HEAD ACHE, DIZZINESS, AND BLURRED VISION X2 DAYS. PT STS THE PAIN WOKE HIM UP OUT PF HIS SLEEP AND HAS BEEN THERE ON AND OFF. DENIES NECK PAIN, N/V.

## 2016-03-14 NOTE — ED Provider Notes (Signed)
[facility] DEPT Provider Note   CSN: [id number] Arrival date & time: 03/14/16  1132     History   Chief Complaint Chief Complaint  Patient presents with  . Headache    HPI PRINTIS ECKENROD is a 83 y.o. male.  The history is provided by the patient.  Headache   This is a new problem. The current episode started 2 days ago. The problem occurs every few hours. The problem has not changed since onset.The headache is associated with nothing. The pain is located in the occipital and frontal region. The quality of the pain is described as dull (pressure). The pain is moderate. The pain does not radiate. Pertinent negatives include no fever, no syncope, no shortness of breath, no nausea and no vomiting. He has tried nothing (doesn't like to take medicines) for the symptoms.    Past Medical History:  Diagnosis Date  . Anemia 12/23/2011  . Ascending aorta dilatation (HCC) 09/02/2011  . BACK PAIN, LUMBAR, WITH RADICULOPATHY 04/01/2010  . Bruising 07/30/2012  . CATARACT EXTRACTIONS, BILATERAL, HX OF 03/01/2010  . COLONIC POLYPS, HX OF 03/01/2010  . Dermatitis 03/30/2011  . ELEVATED BP READING WITHOUT DX HYPERTENSION 03/01/2010  . Fatigue 05/03/2011  . HTN (hypertension) 10/05/2010  . HYPERLIPIDEMIA 03/01/2010  . Nocturia 03/01/2010  . PROSTATE CANCER 03/01/2010  . PROSTATITIS, ACUTE 04/01/2010  . PUD, HX OF 03/01/2010  . Pulmonary nodule 09/14/2010  . Sinusitis 03/30/2011  . SKIN CANCER, HX OF 03/01/2010  . SOB (shortness of breath) 09/04/2011  . Tremor 12/23/2011    Patient Active Problem List   Diagnosis Date Noted  . Protein-calorie malnutrition, severe 10/21/2015  . Community acquired pneumonia   . CAP (community acquired pneumonia) 10/20/2015  . CKD (chronic kidney disease), stage III 10/20/2015  . Long term (current) use of anticoagulants 09/14/2012  . Acute pulmonary embolism ([place]) 09/12/2012  . Bruising 07/30/2012  . Tremor 12/23/2011  . Anemia 12/23/2011  . SOB (shortness of breath)  09/04/2011  . Ascending aorta dilatation (HCC) 09/02/2011  . Fatigue 05/03/2011  . Sinusitis 03/30/2011  . Dermatitis 03/30/2011  . HTN (hypertension) 10/05/2010  . CRF (chronic renal failure) 10/05/2010  . Pulmonary nodule 09/14/2010  . BACK PAIN, LUMBAR, WITH RADICULOPATHY 04/01/2010  . PROSTATITIS, ACUTE 04/01/2010  . PROSTATE CANCER 03/01/2010  . HYPERLIPIDEMIA 03/01/2010  . Chest pain 03/01/2010  . NOCTURIA 03/01/2010  . SKIN CANCER, HX OF 03/01/2010  . PUD, HX OF 03/01/2010  . COLONIC POLYPS, HX OF 03/01/2010  . CATARACT EXTRACTIONS, BILATERAL, HX OF 03/01/2010    Past Surgical History:  Procedure Laterality Date  . ABDOMINAL SURGERY  1960   Correction of PUD and hiatal hernia  . COLONOSCOPY W/ BIOPSIES AND POLYPECTOMY  1980's   several, benign  . Mose surgery to scalp    . TONSILLECTOMY AND ADENOIDECTOMY         Home Medications    Prior to Admission medications   Medication Sig Start Date End Date Taking? Authorizing Provider  acetaminophen (TYLENOL) 500 MG tablet Take 2 tablets (1,000 mg total) by mouth every 6 (six) hours as needed for mild pain or headache. 03/14/16   Leo Grosser, MD  Clobetasol Propionate 0.05 % lotion Apply 1 application topically at bedtime. Applied to lower back. Patient not taking: Reported on 10/20/2015 12/17/12   Kennyth Arnold, FNP  feeding supplement, ENSURE ENLIVE, (ENSURE ENLIVE) LIQD Take 237 mLs by mouth 2 (two) times daily between meals. 10/22/15   Hosie Poisson, MD  levofloxacin (  LEVAQUIN) 750 MG tablet Take 1 tablet (750 mg total) by mouth daily. 10/23/15   Hosie Poisson, MD  metoprolol tartrate (LOPRESSOR) 25 MG tablet Take 12.5 mg by mouth 2 (two) times daily.  10/23/12   Historical Provider, MD  simvastatin (ZOCOR) 20 MG tablet TAKE 1 TABLET BY MOUTH EVERY NIGHT AT BEDTIME 09/14/12   Mosie Lukes, MD  tamsulosin (FLOMAX) 0.4 MG CAPS capsule TAKE ONE CAPSULE BY MOUTH EVERY DAY 04/23/13   Kennyth Arnold, FNP    Family  History Family History  Problem Relation Age of Onset  . Stroke Mother   . Hip fracture Mother   . Lung disease Father     smoker  . Cancer Sister     lung/ smoker  . Heart disease Brother   . Heart attack Brother   . Alcohol abuse Maternal Grandfather   . Cancer Paternal Grandmother     colon  . Pneumonia Brother   . Alcohol abuse Brother   . Achalasia Son     Social History Social History  Substance Use Topics  . Smoking status: Never Smoker  . Smokeless tobacco: Never Used  . Alcohol use No     Allergies   Vioxx [rofecoxib]   Review of Systems Review of Systems  Constitutional: Negative for fever.  Respiratory: Negative for shortness of breath.   Cardiovascular: Negative for syncope.  Gastrointestinal: Negative for nausea and vomiting.  Neurological: Positive for headaches.  All other systems reviewed and are negative.    Physical Exam Updated Vital Signs BP 135/79 (BP Location: Left Arm)   Pulse 67   Temp 98.3 F (36.8 C) (Oral)   Resp 20   Ht 5\' 8"  (1.727 m)   Wt 138 lb 4 oz (62.7 kg)   SpO2 100%   BMI 21.02 kg/m   Physical Exam  Constitutional: He is oriented to person, place, and time. He appears well-developed and well-nourished. No distress.  HENT:  Head: Normocephalic and atraumatic.  Nose: Nose normal.  Mouth/Throat: Oropharynx is clear and moist.  Eyes: Conjunctivae are normal.  Neck: Neck supple. No tracheal deviation present.  Cardiovascular: Normal rate and regular rhythm.   Pulmonary/Chest: Effort normal. No respiratory distress.  Abdominal: Soft. He exhibits no distension.  Neurological: He is alert and oriented to person, place, and time. No cranial nerve deficit or sensory deficit. Coordination normal.  Normal finger to nose testing and rapid alternating movement  Skin: Skin is warm and dry. Capillary refill takes less than 2 seconds.  Psychiatric: He has a normal mood and affect. His behavior is normal.     ED Treatments /  Results  Labs (all labs ordered are listed, but only abnormal results are displayed) Labs Reviewed  BASIC METABOLIC PANEL - Abnormal; Notable for the following:       Result Value   BUN 35 (*)    Creatinine, Ser 1.66 (*)    GFR calc non Af Amer 35 (*)    GFR calc Af Amer 41 (*)    All other components within normal limits  CBC - Abnormal; Notable for the following:    WBC 11.9 (*)    All other components within normal limits  URINALYSIS, ROUTINE W REFLEX MICROSCOPIC    EKG  EKG Interpretation  Date/Time:  Monday March 14 2016 12:30:49 EST Ventricular Rate:  55 PR Interval:    QRS Duration: 111 QT Interval:  458 QTC Calculation: 439 R Axis:   5 Text Interpretation:  Sinus  rhythm Atrial premature complex Prolonged PR interval RSR' in V1 or V2, right VCD or RVH Left ventricular hypertrophy No significant change since last tracing Confirmed by Novalie Leamy MD, [facility] [phone]) on 03/14/2016 2:38:07 PM       Radiology Ct Head Wo Contrast  Result Date: 03/14/2016 CLINICAL DATA:  Headache for 3 days EXAM: CT HEAD WITHOUT CONTRAST TECHNIQUE: Contiguous axial images were obtained from the base of the skull through the vertex without intravenous contrast. COMPARISON:  None. FINDINGS: Brain: There is mild diffuse atrophy. There is no intracranial mass, hemorrhage, extra-axial fluid collection, or midline shift. There is small vessel disease in the centra semiovale bilaterally. There is also small vessel disease in the left basal ganglia. There is no demonstrable acute infarct. Vascular: There is no hyperdense vessel. There are foci of calcification in each carotid siphon. Bony calvarium Skull: Bony calvarium appears intact. Sinuses/Orbits: There is slight mucosal thickening in several ethmoid air cells bilaterally. Visualized paranasal sinuses elsewhere clear. Orbits appear symmetric bilaterally. Other: Mastoid air cells are clear. There is debris in the right external auditory canal. IMPRESSION:  Atrophy with supratentorial small vessel disease. No intracranial mass hemorrhage, or extra-axial fluid collection. No evidence of acute infarct. There are foci of arterial vascular calcification. Mild mucosal thickening noted in ethmoid sinuses. Probable cerumen in the external auditory canal. Electronically Signed   By: Lowella Grip III M.D.   On: 03/14/2016 12:47    Procedures Procedures (including critical care time)  Medications Ordered in ED Medications  acetaminophen (TYLENOL) tablet 1,000 mg (not administered)  aspirin tablet 650 mg (not administered)     Initial Impression / Assessment and Plan / ED Course  I have reviewed the triage vital signs and the nursing notes.  Pertinent labs & imaging results that were available during my care of the patient were reviewed by me and considered in my medical decision making (see chart for details).     83 y.o. male presents with Headache over the last 2 days that is worse in the evenings and feels like pressure. CT from triage is negative for acute findings. Neurologic exam is unremarkable, EKG is without signs of ischemia or focus of arrhythmia. Labs reassuring. Discussed headache medications including Tylenol and aspirin. Both were given here together and I recommended re-dosing Tylenol prior to bedtime and monitoring his symptoms to discuss with his primary care physician. Return precautions were discussed for worsening or new concerning symptoms.  Final Clinical Impressions(s) / ED Diagnoses   Final diagnoses:  Nonintractable headache, unspecified chronicity pattern, unspecified headache type    New Prescriptions Current Discharge Medication List       Leo Grosser, MD 03/14/16 1439

## 2016-03-25 ENCOUNTER — Telehealth: Payer: Self-pay

## 2016-03-25 NOTE — Telephone Encounter (Signed)
SENT NOTES TO SCHEDULING 

## 2016-03-29 ENCOUNTER — Other Ambulatory Visit: Payer: Self-pay | Admitting: Family Medicine

## 2016-03-29 DIAGNOSIS — I350 Nonrheumatic aortic (valve) stenosis: Secondary | ICD-10-CM

## 2016-04-07 ENCOUNTER — Other Ambulatory Visit: Payer: Self-pay | Admitting: Family Medicine

## 2016-04-07 DIAGNOSIS — R109 Unspecified abdominal pain: Secondary | ICD-10-CM

## 2016-04-12 ENCOUNTER — Other Ambulatory Visit (HOSPITAL_COMMUNITY): Payer: Medicare Other

## 2016-04-13 ENCOUNTER — Ambulatory Visit
Admission: RE | Admit: 2016-04-13 | Discharge: 2016-04-13 | Disposition: A | Discharge status: Home / Self Care | Payer: Medicare Other | Source: Ambulatory Visit | Attending: Family Medicine | Admitting: Family Medicine

## 2016-04-13 DIAGNOSIS — R109 Unspecified abdominal pain: Secondary | ICD-10-CM

## 2016-05-02 ENCOUNTER — Ambulatory Visit (HOSPITAL_COMMUNITY): Payer: Medicare Other | Attending: Cardiovascular Disease

## 2016-05-02 ENCOUNTER — Other Ambulatory Visit: Payer: Self-pay

## 2016-05-02 DIAGNOSIS — I351 Nonrheumatic aortic (valve) insufficiency: Secondary | ICD-10-CM | POA: Diagnosis not present

## 2016-05-02 DIAGNOSIS — I350 Nonrheumatic aortic (valve) stenosis: Secondary | ICD-10-CM | POA: Diagnosis not present

## 2016-05-02 DIAGNOSIS — I517 Cardiomegaly: Secondary | ICD-10-CM | POA: Insufficient documentation

## 2016-05-02 DIAGNOSIS — I340 Nonrheumatic mitral (valve) insufficiency: Secondary | ICD-10-CM | POA: Diagnosis not present

## 2016-05-02 DIAGNOSIS — I77819 Aortic ectasia, unspecified site: Secondary | ICD-10-CM | POA: Diagnosis not present

## 2016-07-13 ENCOUNTER — Other Ambulatory Visit: Payer: Self-pay | Admitting: Family Medicine

## 2016-07-13 DIAGNOSIS — R911 Solitary pulmonary nodule: Secondary | ICD-10-CM

## 2016-07-14 ENCOUNTER — Ambulatory Visit
Admission: RE | Admit: 2016-07-14 | Discharge: 2016-07-14 | Disposition: A | Discharge status: Home / Self Care | Payer: Medicare Other | Source: Ambulatory Visit | Attending: Family Medicine | Admitting: Family Medicine

## 2016-07-14 DIAGNOSIS — R911 Solitary pulmonary nodule: Secondary | ICD-10-CM

## 2017-06-21 ENCOUNTER — Other Ambulatory Visit: Payer: Self-pay | Admitting: Family Medicine

## 2017-06-21 DIAGNOSIS — I712 Thoracic aortic aneurysm, without rupture, unspecified: Secondary | ICD-10-CM

## 2017-06-30 ENCOUNTER — Ambulatory Visit
Admission: RE | Admit: 2017-06-30 | Discharge: 2017-06-30 | Disposition: A | Discharge status: Home / Self Care | Payer: Medicare Other | Source: Ambulatory Visit | Attending: Family Medicine | Admitting: Family Medicine

## 2017-06-30 DIAGNOSIS — I712 Thoracic aortic aneurysm, without rupture, unspecified: Secondary | ICD-10-CM

## 2017-06-30 MED ORDER — IOPAMIDOL (ISOVUE-370) INJECTION 76%
60.0000 mL | Freq: Once | INTRAVENOUS | Status: AC | PRN
  Administered 2017-06-30: 60 mL via INTRAVENOUS

## 2017-06-30 MED ORDER — IOPAMIDOL (ISOVUE-370) INJECTION 76%: 75.0000 mL | Freq: Once | INTRAVENOUS | Status: DC | PRN

## 2019-02-13 ENCOUNTER — Ambulatory Visit: Payer: Medicare Other | Attending: Internal Medicine

## 2019-02-13 DIAGNOSIS — Z23 Encounter for immunization: Secondary | ICD-10-CM | POA: Insufficient documentation

## 2019-02-13 NOTE — Progress Notes (Signed)
   Covid-19 Vaccination Clinic  Name:  Sergio Anderson    MRN: [id number] DOB: April 18, 1926  02/13/2019  Sergio Anderson was observed post Covid-19 immunization for 15 minutes without incidence. He was provided with Vaccine Information Sheet and instruction to access the V-Safe system.   Sergio Anderson was instructed to call 911 with any severe reactions post vaccine: Marland Kitchen Difficulty breathing  . Swelling of your face and throat  . A fast heartbeat  . A bad rash all over your body  . Dizziness and weakness    Immunizations Administered    Name Date Dose VIS Date Route   Pfizer COVID-19 Vaccine 02/13/2019 12:15 PM 0.3 mL 01/04/2019 Intramuscular   Manufacturer: [place]   Lot: [id number]   [place]: [id number]

## 2019-03-04 ENCOUNTER — Ambulatory Visit: Payer: Medicare Other | Attending: Internal Medicine

## 2019-03-04 DIAGNOSIS — Z23 Encounter for immunization: Secondary | ICD-10-CM

## 2019-03-04 NOTE — Progress Notes (Signed)
   Covid-19 Vaccination Clinic  Name:  Sergio Anderson    MRN: [id number] DOB: 12/04/1926  03/04/2019  [name] was observed post Covid-19 immunization for 15 minutes without incidence. He was provided with Vaccine Information Sheet and instruction to access the V-Safe system.   [name] was instructed to call 911 with any severe reactions post vaccine: Marland Kitchen Difficulty breathing  . Swelling of your face and throat  . A fast heartbeat  . A bad rash all over your body  . Dizziness and weakness    Immunizations Administered    Name Date Dose VIS Date Route   Pfizer COVID-19 Vaccine 03/04/2019  5:48 PM 0.3 mL 01/04/2019 Intramuscular   Manufacturer: [place]   Lot: [id number]   [place]: [id number]

## 2019-06-10 ENCOUNTER — Other Ambulatory Visit: Payer: Self-pay | Admitting: Family Medicine

## 2019-06-10 DIAGNOSIS — M79671 Pain in right foot: Secondary | ICD-10-CM

## 2019-06-10 DIAGNOSIS — M79672 Pain in left foot: Secondary | ICD-10-CM

## 2019-06-17 ENCOUNTER — Ambulatory Visit
Admission: RE | Admit: 2019-06-17 | Discharge: 2019-06-17 | Disposition: A | Discharge status: Home / Self Care | Payer: Medicare Other | Source: Ambulatory Visit | Attending: Family Medicine | Admitting: Family Medicine

## 2019-06-17 DIAGNOSIS — M79672 Pain in left foot: Secondary | ICD-10-CM

## 2019-06-17 DIAGNOSIS — M79671 Pain in right foot: Secondary | ICD-10-CM

## 2020-03-16 DIAGNOSIS — D485 Neoplasm of uncertain behavior of skin: Secondary | ICD-10-CM | POA: Diagnosis not present

## 2020-03-16 DIAGNOSIS — L820 Inflamed seborrheic keratosis: Secondary | ICD-10-CM | POA: Diagnosis not present

## 2020-03-16 DIAGNOSIS — C44529 Squamous cell carcinoma of skin of other part of trunk: Secondary | ICD-10-CM | POA: Diagnosis not present

## 2020-03-16 DIAGNOSIS — L821 Other seborrheic keratosis: Secondary | ICD-10-CM | POA: Diagnosis not present

## 2020-03-16 DIAGNOSIS — D225 Melanocytic nevi of trunk: Secondary | ICD-10-CM | POA: Diagnosis not present

## 2020-03-16 DIAGNOSIS — C44629 Squamous cell carcinoma of skin of left upper limb, including shoulder: Secondary | ICD-10-CM | POA: Diagnosis not present

## 2020-04-16 DIAGNOSIS — Z961 Presence of intraocular lens: Secondary | ICD-10-CM | POA: Diagnosis not present

## 2020-04-16 DIAGNOSIS — H524 Presbyopia: Secondary | ICD-10-CM | POA: Diagnosis not present

## 2020-04-16 DIAGNOSIS — H52203 Unspecified astigmatism, bilateral: Secondary | ICD-10-CM | POA: Diagnosis not present

## 2020-04-16 DIAGNOSIS — H3530 Unspecified macular degeneration: Secondary | ICD-10-CM | POA: Diagnosis not present

## 2020-04-21 DIAGNOSIS — R413 Other amnesia: Secondary | ICD-10-CM | POA: Diagnosis not present

## 2020-04-21 DIAGNOSIS — R946 Abnormal results of thyroid function studies: Secondary | ICD-10-CM | POA: Diagnosis not present

## 2020-04-21 DIAGNOSIS — I10 Essential (primary) hypertension: Secondary | ICD-10-CM | POA: Diagnosis not present

## 2020-04-21 DIAGNOSIS — I350 Nonrheumatic aortic (valve) stenosis: Secondary | ICD-10-CM | POA: Diagnosis not present

## 2020-06-02 DIAGNOSIS — E079 Disorder of thyroid, unspecified: Secondary | ICD-10-CM | POA: Diagnosis not present

## 2020-06-15 DIAGNOSIS — L570 Actinic keratosis: Secondary | ICD-10-CM | POA: Diagnosis not present

## 2020-06-15 DIAGNOSIS — D0461 Carcinoma in situ of skin of right upper limb, including shoulder: Secondary | ICD-10-CM | POA: Diagnosis not present

## 2020-06-15 DIAGNOSIS — C44622 Squamous cell carcinoma of skin of right upper limb, including shoulder: Secondary | ICD-10-CM | POA: Diagnosis not present

## 2020-07-16 DIAGNOSIS — R413 Other amnesia: Secondary | ICD-10-CM | POA: Diagnosis not present

## 2020-07-16 DIAGNOSIS — I10 Essential (primary) hypertension: Secondary | ICD-10-CM | POA: Diagnosis not present

## 2020-07-16 DIAGNOSIS — E079 Disorder of thyroid, unspecified: Secondary | ICD-10-CM | POA: Diagnosis not present

## 2020-07-21 DIAGNOSIS — Z0000 Encounter for general adult medical examination without abnormal findings: Secondary | ICD-10-CM | POA: Diagnosis not present

## 2020-07-21 DIAGNOSIS — Z23 Encounter for immunization: Secondary | ICD-10-CM | POA: Diagnosis not present

## 2020-07-21 DIAGNOSIS — E875 Hyperkalemia: Secondary | ICD-10-CM | POA: Diagnosis not present

## 2020-07-21 DIAGNOSIS — R413 Other amnesia: Secondary | ICD-10-CM | POA: Diagnosis not present

## 2020-07-24 DIAGNOSIS — E875 Hyperkalemia: Secondary | ICD-10-CM | POA: Diagnosis not present

## 2020-08-10 DIAGNOSIS — I10 Essential (primary) hypertension: Secondary | ICD-10-CM | POA: Diagnosis not present

## 2020-08-10 DIAGNOSIS — N1832 Chronic kidney disease, stage 3b: Secondary | ICD-10-CM | POA: Diagnosis not present

## 2020-09-11 DIAGNOSIS — Z5111 Encounter for antineoplastic chemotherapy: Secondary | ICD-10-CM | POA: Diagnosis not present

## 2020-09-15 DIAGNOSIS — L820 Inflamed seborrheic keratosis: Secondary | ICD-10-CM | POA: Diagnosis not present

## 2020-09-15 DIAGNOSIS — C44529 Squamous cell carcinoma of skin of other part of trunk: Secondary | ICD-10-CM | POA: Diagnosis not present

## 2020-11-11 DIAGNOSIS — E039 Hypothyroidism, unspecified: Secondary | ICD-10-CM | POA: Diagnosis not present

## 2020-11-11 DIAGNOSIS — I350 Nonrheumatic aortic (valve) stenosis: Secondary | ICD-10-CM | POA: Diagnosis not present

## 2020-11-11 DIAGNOSIS — Z23 Encounter for immunization: Secondary | ICD-10-CM | POA: Diagnosis not present

## 2020-11-11 DIAGNOSIS — I10 Essential (primary) hypertension: Secondary | ICD-10-CM | POA: Diagnosis not present

## 2020-11-18 DIAGNOSIS — N1832 Chronic kidney disease, stage 3b: Secondary | ICD-10-CM | POA: Diagnosis not present

## 2020-11-18 DIAGNOSIS — M542 Cervicalgia: Secondary | ICD-10-CM | POA: Diagnosis not present

## 2020-12-21 DIAGNOSIS — L853 Xerosis cutis: Secondary | ICD-10-CM | POA: Diagnosis not present

## 2020-12-21 DIAGNOSIS — Z08 Encounter for follow-up examination after completed treatment for malignant neoplasm: Secondary | ICD-10-CM | POA: Diagnosis not present

## 2020-12-21 DIAGNOSIS — C44529 Squamous cell carcinoma of skin of other part of trunk: Secondary | ICD-10-CM | POA: Diagnosis not present

## 2020-12-21 DIAGNOSIS — Z86007 Personal history of in-situ neoplasm of skin: Secondary | ICD-10-CM | POA: Diagnosis not present

## 2020-12-21 DIAGNOSIS — L820 Inflamed seborrheic keratosis: Secondary | ICD-10-CM | POA: Diagnosis not present

## 2020-12-21 DIAGNOSIS — C44329 Squamous cell carcinoma of skin of other parts of face: Secondary | ICD-10-CM | POA: Diagnosis not present

## 2020-12-21 DIAGNOSIS — D485 Neoplasm of uncertain behavior of skin: Secondary | ICD-10-CM | POA: Diagnosis not present

## 2021-01-20 DIAGNOSIS — I350 Nonrheumatic aortic (valve) stenosis: Secondary | ICD-10-CM | POA: Diagnosis not present

## 2021-01-20 DIAGNOSIS — N1832 Chronic kidney disease, stage 3b: Secondary | ICD-10-CM | POA: Diagnosis not present

## 2021-03-22 DIAGNOSIS — S81802A Unspecified open wound, left lower leg, initial encounter: Secondary | ICD-10-CM | POA: Diagnosis not present

## 2021-03-23 DIAGNOSIS — L821 Other seborrheic keratosis: Secondary | ICD-10-CM | POA: Diagnosis not present

## 2021-03-23 DIAGNOSIS — L570 Actinic keratosis: Secondary | ICD-10-CM | POA: Diagnosis not present

## 2021-03-23 DIAGNOSIS — D225 Melanocytic nevi of trunk: Secondary | ICD-10-CM | POA: Diagnosis not present

## 2021-03-23 DIAGNOSIS — L814 Other melanin hyperpigmentation: Secondary | ICD-10-CM | POA: Diagnosis not present

## 2021-03-23 DIAGNOSIS — L820 Inflamed seborrheic keratosis: Secondary | ICD-10-CM | POA: Diagnosis not present

## 2021-03-23 DIAGNOSIS — Z85828 Personal history of other malignant neoplasm of skin: Secondary | ICD-10-CM | POA: Diagnosis not present

## 2021-03-23 DIAGNOSIS — L538 Other specified erythematous conditions: Secondary | ICD-10-CM | POA: Diagnosis not present

## 2021-03-23 DIAGNOSIS — Z86007 Personal history of in-situ neoplasm of skin: Secondary | ICD-10-CM | POA: Diagnosis not present

## 2021-03-23 DIAGNOSIS — Z08 Encounter for follow-up examination after completed treatment for malignant neoplasm: Secondary | ICD-10-CM | POA: Diagnosis not present

## 2021-03-23 DIAGNOSIS — S80212A Abrasion, left knee, initial encounter: Secondary | ICD-10-CM | POA: Diagnosis not present

## 2021-03-26 DIAGNOSIS — S81812D Laceration without foreign body, left lower leg, subsequent encounter: Secondary | ICD-10-CM | POA: Diagnosis not present

## 2021-04-02 DIAGNOSIS — L03116 Cellulitis of left lower limb: Secondary | ICD-10-CM | POA: Diagnosis not present

## 2021-04-09 DIAGNOSIS — S81812D Laceration without foreign body, left lower leg, subsequent encounter: Secondary | ICD-10-CM | POA: Diagnosis not present

## 2021-07-21 DIAGNOSIS — L570 Actinic keratosis: Secondary | ICD-10-CM | POA: Diagnosis not present

## 2021-07-21 DIAGNOSIS — L820 Inflamed seborrheic keratosis: Secondary | ICD-10-CM | POA: Diagnosis not present

## 2021-07-21 DIAGNOSIS — L298 Other pruritus: Secondary | ICD-10-CM | POA: Diagnosis not present

## 2021-07-21 DIAGNOSIS — L538 Other specified erythematous conditions: Secondary | ICD-10-CM | POA: Diagnosis not present

## 2021-07-21 DIAGNOSIS — L821 Other seborrheic keratosis: Secondary | ICD-10-CM | POA: Diagnosis not present

## 2021-07-21 DIAGNOSIS — D485 Neoplasm of uncertain behavior of skin: Secondary | ICD-10-CM | POA: Diagnosis not present

## 2021-07-23 DIAGNOSIS — E538 Deficiency of other specified B group vitamins: Secondary | ICD-10-CM | POA: Diagnosis not present

## 2021-07-23 DIAGNOSIS — I350 Nonrheumatic aortic (valve) stenosis: Secondary | ICD-10-CM | POA: Diagnosis not present

## 2021-07-23 DIAGNOSIS — E079 Disorder of thyroid, unspecified: Secondary | ICD-10-CM | POA: Diagnosis not present

## 2021-07-23 DIAGNOSIS — I700 Atherosclerosis of aorta: Secondary | ICD-10-CM | POA: Diagnosis not present

## 2021-07-23 DIAGNOSIS — E441 Mild protein-calorie malnutrition: Secondary | ICD-10-CM | POA: Diagnosis not present

## 2021-07-23 DIAGNOSIS — R413 Other amnesia: Secondary | ICD-10-CM | POA: Diagnosis not present

## 2021-07-23 DIAGNOSIS — I10 Essential (primary) hypertension: Secondary | ICD-10-CM | POA: Diagnosis not present

## 2021-07-23 DIAGNOSIS — N1832 Chronic kidney disease, stage 3b: Secondary | ICD-10-CM | POA: Diagnosis not present

## 2021-07-23 DIAGNOSIS — Z0000 Encounter for general adult medical examination without abnormal findings: Secondary | ICD-10-CM | POA: Diagnosis not present

## 2021-08-02 DIAGNOSIS — E875 Hyperkalemia: Secondary | ICD-10-CM | POA: Diagnosis not present

## 2021-10-25 DIAGNOSIS — C44729 Squamous cell carcinoma of skin of left lower limb, including hip: Secondary | ICD-10-CM | POA: Diagnosis not present

## 2021-10-25 DIAGNOSIS — L298 Other pruritus: Secondary | ICD-10-CM | POA: Diagnosis not present

## 2021-10-25 DIAGNOSIS — L538 Other specified erythematous conditions: Secondary | ICD-10-CM | POA: Diagnosis not present

## 2021-10-25 DIAGNOSIS — L578 Other skin changes due to chronic exposure to nonionizing radiation: Secondary | ICD-10-CM | POA: Diagnosis not present

## 2021-10-25 DIAGNOSIS — D485 Neoplasm of uncertain behavior of skin: Secondary | ICD-10-CM | POA: Diagnosis not present

## 2021-10-25 DIAGNOSIS — L821 Other seborrheic keratosis: Secondary | ICD-10-CM | POA: Diagnosis not present

## 2021-10-25 DIAGNOSIS — L080 Pyoderma: Secondary | ICD-10-CM | POA: Diagnosis not present

## 2021-10-25 DIAGNOSIS — L820 Inflamed seborrheic keratosis: Secondary | ICD-10-CM | POA: Diagnosis not present

## 2021-10-25 DIAGNOSIS — L570 Actinic keratosis: Secondary | ICD-10-CM | POA: Diagnosis not present

## 2021-11-26 DIAGNOSIS — L821 Other seborrheic keratosis: Secondary | ICD-10-CM | POA: Diagnosis not present

## 2021-11-26 DIAGNOSIS — C44729 Squamous cell carcinoma of skin of left lower limb, including hip: Secondary | ICD-10-CM | POA: Diagnosis not present

## 2021-12-08 DIAGNOSIS — L538 Other specified erythematous conditions: Secondary | ICD-10-CM | POA: Diagnosis not present

## 2021-12-08 DIAGNOSIS — L820 Inflamed seborrheic keratosis: Secondary | ICD-10-CM | POA: Diagnosis not present

## 2021-12-08 DIAGNOSIS — L570 Actinic keratosis: Secondary | ICD-10-CM | POA: Diagnosis not present

## 2021-12-08 DIAGNOSIS — L578 Other skin changes due to chronic exposure to nonionizing radiation: Secondary | ICD-10-CM | POA: Diagnosis not present

## 2021-12-10 DIAGNOSIS — I350 Nonrheumatic aortic (valve) stenosis: Secondary | ICD-10-CM | POA: Diagnosis not present

## 2021-12-10 DIAGNOSIS — N1832 Chronic kidney disease, stage 3b: Secondary | ICD-10-CM | POA: Diagnosis not present

## 2021-12-11 ENCOUNTER — Encounter (HOSPITAL_BASED_OUTPATIENT_CLINIC_OR_DEPARTMENT_OTHER): Payer: Self-pay | Admitting: Emergency Medicine

## 2021-12-11 ENCOUNTER — Emergency Department (HOSPITAL_BASED_OUTPATIENT_CLINIC_OR_DEPARTMENT_OTHER)
Admission: EM | Admit: 2021-12-11 | Discharge: 2021-12-11 | Disposition: A | Discharge status: Home / Self Care | Payer: Medicare Other | Attending: Emergency Medicine | Admitting: Emergency Medicine

## 2021-12-11 DIAGNOSIS — R799 Abnormal finding of blood chemistry, unspecified: Secondary | ICD-10-CM | POA: Diagnosis present

## 2021-12-11 DIAGNOSIS — R030 Elevated blood-pressure reading, without diagnosis of hypertension: Secondary | ICD-10-CM | POA: Diagnosis not present

## 2021-12-11 DIAGNOSIS — R001 Bradycardia, unspecified: Secondary | ICD-10-CM | POA: Diagnosis not present

## 2021-12-11 DIAGNOSIS — N1832 Chronic kidney disease, stage 3b: Secondary | ICD-10-CM | POA: Insufficient documentation

## 2021-12-11 DIAGNOSIS — I129 Hypertensive chronic kidney disease with stage 1 through stage 4 chronic kidney disease, or unspecified chronic kidney disease: Secondary | ICD-10-CM | POA: Diagnosis not present

## 2021-12-11 LAB — BASIC METABOLIC PANEL
Anion gap: 8 (ref 5–15)
BUN: 39 mg/dL — ABNORMAL HIGH (ref 8–23)
CO2: 21 mmol/L — ABNORMAL LOW (ref 22–32)
Calcium: 9.8 mg/dL (ref 8.9–10.3)
Chloride: 107 mmol/L (ref 98–111)
Creatinine, Ser: 2.04 mg/dL — ABNORMAL HIGH (ref 0.61–1.24)
GFR, Estimated: 30 mL/min — ABNORMAL LOW (ref >60)
Glucose, Bld: 101 mg/dL — ABNORMAL HIGH (ref 70–99)
Potassium: 5 mmol/L (ref 3.5–5.1)
Sodium: 136 mmol/L (ref 135–145)

## 2021-12-11 NOTE — ED Triage Notes (Signed)
Potassium 6.1 called from primary to alert patients family. Pt was being seen to have stitches removed from his leg (melanoma removed). Md advised recheck of labs

## 2021-12-11 NOTE — ED Provider Notes (Addendum)
[facility] Provider Note   CSN: [id number] Arrival date & time: 12/11/21  3557     History  Chief Complaint  Patient presents with   abnormal labs    KENDELL SAGRAVES is a 88 y.o. male.  Pt c/o being told that some routine lab work done yesterday as outpatient showed k was high and to come to ED for recheck.  Pt indicates feels at baseline, does not feel acutely weak, ill or sick. Family members notes somewhat decreased po intake. Pt denies abd pain or nausea/vomiting. No numbness, no weakness. No fever/chills. Feels is making normal amount of urine. Not on potassium supplementation.   The history is provided by the patient, medical records and a relative.       Home Medications Prior to Admission medications   Medication Sig Start Date End Date Taking? Authorizing Provider  acetaminophen (TYLENOL) 500 MG tablet Take 2 tablets (1,000 mg total) by mouth every 6 (six) hours as needed for mild pain or headache. 03/14/16   Leo Grosser, MD  Clobetasol Propionate 0.05 % lotion Apply 1 application topically at bedtime. Applied to lower back. Patient not taking: Reported on 10/20/2015 12/17/12   Dutch Quint B, FNP  feeding supplement, ENSURE ENLIVE, (ENSURE ENLIVE) LIQD Take 237 mLs by mouth 2 (two) times daily between meals. 10/22/15   Hosie Poisson, MD  levofloxacin (LEVAQUIN) 750 MG tablet Take 1 tablet (750 mg total) by mouth daily. 10/23/15   Hosie Poisson, MD  metoprolol tartrate (LOPRESSOR) 25 MG tablet Take 12.5 mg by mouth 2 (two) times daily.  10/23/12   [provider]  simvastatin (ZOCOR) 20 MG tablet TAKE 1 TABLET BY MOUTH EVERY NIGHT AT BEDTIME 09/14/12   Mosie Lukes, MD  tamsulosin (FLOMAX) 0.4 MG CAPS capsule TAKE ONE CAPSULE BY MOUTH EVERY DAY 04/23/13   Kennyth Arnold, FNP      Allergies    Vioxx [rofecoxib]    Review of Systems   Review of Systems  Constitutional:  Negative for fever.  HENT:  Negative for trouble  swallowing.   Respiratory:  Negative for shortness of breath.   Cardiovascular:  Negative for chest pain.  Gastrointestinal:  Negative for abdominal pain, diarrhea and vomiting.  Genitourinary:  Negative for difficulty urinating and dysuria.  Musculoskeletal:  Negative for back pain.  Neurological:  Negative for weakness.    Physical Exam Updated Vital Signs BP (!) 179/71   Pulse (!) 57   Temp 97.7 F (36.5 C) (Oral)   Resp 16   SpO2 100%  Physical Exam Vitals and nursing note reviewed.  Constitutional:      Appearance: Normal appearance. He is well-developed.  HENT:     Head: Atraumatic.     Nose: Nose normal.     Mouth/Throat:     Mouth: Mucous membranes are moist.  Eyes:     General: No scleral icterus.    Conjunctiva/sclera: Conjunctivae normal.  Neck:     Trachea: No tracheal deviation.  Cardiovascular:     Rate and Rhythm: Normal rate and regular rhythm.     Pulses: Normal pulses.     Heart sounds: Normal heart sounds. No murmur heard.    No friction rub. No gallop.  Pulmonary:     Effort: Pulmonary effort is normal. No accessory muscle usage or respiratory distress.     Breath sounds: Normal breath sounds.  Abdominal:     General: There is no distension.     Palpations: Abdomen  is soft.     Tenderness: There is no abdominal tenderness. There is no guarding.  Genitourinary:    Comments: No cva tenderness. Musculoskeletal:        General: No swelling or tenderness.     Cervical back: Neck supple.  Skin:    General: Skin is warm and dry.     Findings: No rash.  Neurological:     Mental Status: He is alert.     Comments: Alert, speech clear.   Psychiatric:        Mood and Affect: Mood normal.     ED Results / Procedures / Treatments   Labs (all labs ordered are listed, but only abnormal results are displayed) Results for orders placed or performed during the hospital encounter of 62/03/55  Basic metabolic panel  Result Value Ref Range   Sodium 136  135 - 145 mmol/L   Potassium 5.0 3.5 - 5.1 mmol/L   Chloride 107 98 - 111 mmol/L   CO2 21 (L) 22 - 32 mmol/L   Glucose, Bld 101 (H) 70 - 99 mg/dL   BUN 39 (H) 8 - 23 mg/dL   Creatinine, Ser 2.04 (H) 0.61 - 1.24 mg/dL   Calcium 9.8 8.9 - 10.3 mg/dL   GFR, Estimated 30 (L) >60 mL/min   Anion gap 8 5 - 15     ED ECG REPORT   Date: 12/11/2021  Rate: 58  Rhythm: sinus bradycardia  QRS Axis: normal  Intervals: normal  ST/T Wave abnormalities: normal  Conduction Disutrbances:none  Narrative Interpretation:   Old EKG Reviewed: none available  I have personally reviewed the EKG tracing   Radiology No results found.  Procedures Procedures    Medications Ordered in ED Medications - No data to display  ED Course/ Medical Decision Making/ A&P                           Medical Decision Making Problems Addressed: Elevated blood pressure reading: acute illness or injury Stage 3b chronic kidney disease ([place]): chronic illness or injury with exacerbation, progression, or side effects of treatment that poses a threat to life or bodily functions  Amount and/or Complexity of Data Reviewed External Data Reviewed: labs and notes. Labs: ordered. Decision-making details documented in ED Course. ECG/medicine tests: ordered and independent interpretation performed. Decision-making details documented in ED Course.  Risk Decision regarding hospitalization.  Pt reports k high.    Diff dx includes ckd, aki, hyperkalemia - dispo decision including potential need for admission considered if k high - will get labs and reassess.  Labs sent.  Reviewed nursing notes and prior charts for additional history.   Labs reviewed/interpreted by me - k normal. Hx ckd.   Pt appears stable for d/c.  Rec pcp f/u.  Return precautions provided.           Final Clinical Impression(s) / ED Diagnoses Final diagnoses:  None    Rx / DC Orders ED Discharge Orders     None            Lajean Saver, MD 12/11/21 1159

## 2021-12-11 NOTE — ED Notes (Signed)
Discharge paperwork given and verbally understood. 

## 2021-12-11 NOTE — Discharge Instructions (Addendum)
It was our pleasure to provide your ER care today - we hope that you feel better.  Your potassium level is normal today.  Follow up with primary care doctor in the next couple weeks.   Return to ER if worse, new symptoms, fevers, trouble breathing, chest pain, or other concern.

## 2021-12-14 ENCOUNTER — Telehealth: Payer: Self-pay

## 2021-12-14 NOTE — Telephone Encounter (Signed)
        Patient  visited [facility] on 11/18   Telephone encounter attempt :  1st  A HIPAA compliant voice message was left requesting a return call.  Instructed patient to call back    [facility]  [phone] [address], [place], [place] [address] Phone: [phone] Email: [email or web address]

## 2021-12-15 ENCOUNTER — Telehealth: Payer: Self-pay

## 2021-12-15 NOTE — Telephone Encounter (Signed)
        Patient  visited [facility] on 11/18    Telephone encounter attempt :  2nd  A HIPAA compliant voice message was left requesting a return call.  Instructed patient to call back    [facility]  [phone] [address], [place],  [address] Phone: [phone] Email: [email or web address]

## 2022-03-16 DIAGNOSIS — Z85828 Personal history of other malignant neoplasm of skin: Secondary | ICD-10-CM | POA: Diagnosis not present

## 2022-03-16 DIAGNOSIS — L308 Other specified dermatitis: Secondary | ICD-10-CM | POA: Diagnosis not present

## 2022-03-16 DIAGNOSIS — L820 Inflamed seborrheic keratosis: Secondary | ICD-10-CM | POA: Diagnosis not present

## 2022-03-16 DIAGNOSIS — Z08 Encounter for follow-up examination after completed treatment for malignant neoplasm: Secondary | ICD-10-CM | POA: Diagnosis not present

## 2022-05-18 DIAGNOSIS — D485 Neoplasm of uncertain behavior of skin: Secondary | ICD-10-CM | POA: Diagnosis not present

## 2022-05-18 DIAGNOSIS — C44722 Squamous cell carcinoma of skin of right lower limb, including hip: Secondary | ICD-10-CM | POA: Diagnosis not present

## 2022-05-18 DIAGNOSIS — L308 Other specified dermatitis: Secondary | ICD-10-CM | POA: Diagnosis not present

## 2022-07-04 DIAGNOSIS — Z48817 Encounter for surgical aftercare following surgery on the skin and subcutaneous tissue: Secondary | ICD-10-CM | POA: Diagnosis not present

## 2022-07-04 DIAGNOSIS — S81801A Unspecified open wound, right lower leg, initial encounter: Secondary | ICD-10-CM | POA: Diagnosis not present

## 2022-07-22 DIAGNOSIS — L080 Pyoderma: Secondary | ICD-10-CM | POA: Diagnosis not present

## 2022-07-26 DIAGNOSIS — R413 Other amnesia: Secondary | ICD-10-CM | POA: Diagnosis not present

## 2022-07-26 DIAGNOSIS — N184 Chronic kidney disease, stage 4 (severe): Secondary | ICD-10-CM | POA: Diagnosis not present

## 2022-07-26 DIAGNOSIS — J479 Bronchiectasis, uncomplicated: Secondary | ICD-10-CM | POA: Diagnosis not present

## 2022-07-26 DIAGNOSIS — I5189 Other ill-defined heart diseases: Secondary | ICD-10-CM | POA: Diagnosis not present

## 2022-07-26 DIAGNOSIS — E538 Deficiency of other specified B group vitamins: Secondary | ICD-10-CM | POA: Diagnosis not present

## 2022-07-26 DIAGNOSIS — I700 Atherosclerosis of aorta: Secondary | ICD-10-CM | POA: Diagnosis not present

## 2022-07-26 DIAGNOSIS — Z0000 Encounter for general adult medical examination without abnormal findings: Secondary | ICD-10-CM | POA: Diagnosis not present

## 2022-07-26 DIAGNOSIS — I7121 Aneurysm of the ascending aorta, without rupture: Secondary | ICD-10-CM | POA: Diagnosis not present

## 2022-07-26 DIAGNOSIS — I10 Essential (primary) hypertension: Secondary | ICD-10-CM | POA: Diagnosis not present

## 2022-09-09 DIAGNOSIS — L905 Scar conditions and fibrosis of skin: Secondary | ICD-10-CM | POA: Diagnosis not present

## 2022-09-09 DIAGNOSIS — L233 Allergic contact dermatitis due to drugs in contact with skin: Secondary | ICD-10-CM | POA: Diagnosis not present

## 2022-09-09 DIAGNOSIS — L308 Other specified dermatitis: Secondary | ICD-10-CM | POA: Diagnosis not present

## 2022-09-09 DIAGNOSIS — L2089 Other atopic dermatitis: Secondary | ICD-10-CM | POA: Diagnosis not present

## 2022-09-09 DIAGNOSIS — L244 Irritant contact dermatitis due to drugs in contact with skin: Secondary | ICD-10-CM | POA: Diagnosis not present

## 2022-09-09 DIAGNOSIS — Z08 Encounter for follow-up examination after completed treatment for malignant neoplasm: Secondary | ICD-10-CM | POA: Diagnosis not present

## 2022-09-09 DIAGNOSIS — L570 Actinic keratosis: Secondary | ICD-10-CM | POA: Diagnosis not present

## 2022-09-09 DIAGNOSIS — Z85828 Personal history of other malignant neoplasm of skin: Secondary | ICD-10-CM | POA: Diagnosis not present

## 2022-10-11 DIAGNOSIS — L538 Other specified erythematous conditions: Secondary | ICD-10-CM | POA: Diagnosis not present

## 2022-10-11 DIAGNOSIS — C44629 Squamous cell carcinoma of skin of left upper limb, including shoulder: Secondary | ICD-10-CM | POA: Diagnosis not present

## 2022-10-11 DIAGNOSIS — L570 Actinic keratosis: Secondary | ICD-10-CM | POA: Diagnosis not present

## 2022-10-11 DIAGNOSIS — L233 Allergic contact dermatitis due to drugs in contact with skin: Secondary | ICD-10-CM | POA: Diagnosis not present

## 2022-10-11 DIAGNOSIS — L578 Other skin changes due to chronic exposure to nonionizing radiation: Secondary | ICD-10-CM | POA: Diagnosis not present

## 2022-10-11 DIAGNOSIS — D485 Neoplasm of uncertain behavior of skin: Secondary | ICD-10-CM | POA: Diagnosis not present

## 2022-10-11 DIAGNOSIS — L244 Irritant contact dermatitis due to drugs in contact with skin: Secondary | ICD-10-CM | POA: Diagnosis not present

## 2022-10-26 DIAGNOSIS — I350 Nonrheumatic aortic (valve) stenosis: Secondary | ICD-10-CM | POA: Diagnosis not present

## 2022-10-26 DIAGNOSIS — I10 Essential (primary) hypertension: Secondary | ICD-10-CM | POA: Diagnosis not present

## 2022-10-26 DIAGNOSIS — E441 Mild protein-calorie malnutrition: Secondary | ICD-10-CM | POA: Diagnosis not present

## 2022-10-26 DIAGNOSIS — Z23 Encounter for immunization: Secondary | ICD-10-CM | POA: Diagnosis not present

## 2022-11-03 DIAGNOSIS — I10 Essential (primary) hypertension: Secondary | ICD-10-CM | POA: Diagnosis not present

## 2022-11-22 DIAGNOSIS — L578 Other skin changes due to chronic exposure to nonionizing radiation: Secondary | ICD-10-CM | POA: Diagnosis not present

## 2022-11-22 DIAGNOSIS — L538 Other specified erythematous conditions: Secondary | ICD-10-CM | POA: Diagnosis not present

## 2022-11-22 DIAGNOSIS — Z09 Encounter for follow-up examination after completed treatment for conditions other than malignant neoplasm: Secondary | ICD-10-CM | POA: Diagnosis not present

## 2022-11-22 DIAGNOSIS — L309 Dermatitis, unspecified: Secondary | ICD-10-CM | POA: Diagnosis not present

## 2022-11-22 DIAGNOSIS — L820 Inflamed seborrheic keratosis: Secondary | ICD-10-CM | POA: Diagnosis not present

## 2022-12-30 DIAGNOSIS — R58 Hemorrhage, not elsewhere classified: Secondary | ICD-10-CM | POA: Diagnosis not present

## 2022-12-30 DIAGNOSIS — L814 Other melanin hyperpigmentation: Secondary | ICD-10-CM | POA: Diagnosis not present

## 2022-12-30 DIAGNOSIS — L853 Xerosis cutis: Secondary | ICD-10-CM | POA: Diagnosis not present

## 2022-12-30 DIAGNOSIS — L821 Other seborrheic keratosis: Secondary | ICD-10-CM | POA: Diagnosis not present

## 2022-12-30 DIAGNOSIS — L820 Inflamed seborrheic keratosis: Secondary | ICD-10-CM | POA: Diagnosis not present

## 2022-12-30 DIAGNOSIS — L570 Actinic keratosis: Secondary | ICD-10-CM | POA: Diagnosis not present

## 2022-12-30 DIAGNOSIS — L538 Other specified erythematous conditions: Secondary | ICD-10-CM | POA: Diagnosis not present

## 2022-12-30 DIAGNOSIS — L2989 Other pruritus: Secondary | ICD-10-CM | POA: Diagnosis not present

## 2023-01-26 DIAGNOSIS — J0110 Acute frontal sinusitis, unspecified: Secondary | ICD-10-CM | POA: Diagnosis not present

## 2023-01-26 DIAGNOSIS — J479 Bronchiectasis, uncomplicated: Secondary | ICD-10-CM | POA: Diagnosis not present

## 2023-01-26 DIAGNOSIS — E538 Deficiency of other specified B group vitamins: Secondary | ICD-10-CM | POA: Diagnosis not present

## 2023-01-26 DIAGNOSIS — I10 Essential (primary) hypertension: Secondary | ICD-10-CM | POA: Diagnosis not present

## 2023-01-26 DIAGNOSIS — E875 Hyperkalemia: Secondary | ICD-10-CM | POA: Diagnosis not present

## 2023-02-09 DIAGNOSIS — E875 Hyperkalemia: Secondary | ICD-10-CM | POA: Diagnosis not present

## 2023-03-27 DIAGNOSIS — N1832 Chronic kidney disease, stage 3b: Secondary | ICD-10-CM | POA: Diagnosis not present

## 2023-03-27 DIAGNOSIS — E538 Deficiency of other specified B group vitamins: Secondary | ICD-10-CM | POA: Diagnosis not present

## 2023-03-27 DIAGNOSIS — R5383 Other fatigue: Secondary | ICD-10-CM | POA: Diagnosis not present

## 2023-03-27 DIAGNOSIS — E875 Hyperkalemia: Secondary | ICD-10-CM | POA: Diagnosis not present

## 2023-05-29 DIAGNOSIS — E875 Hyperkalemia: Secondary | ICD-10-CM | POA: Diagnosis not present

## 2023-05-29 DIAGNOSIS — R5383 Other fatigue: Secondary | ICD-10-CM | POA: Diagnosis not present

## 2023-05-29 DIAGNOSIS — E538 Deficiency of other specified B group vitamins: Secondary | ICD-10-CM | POA: Diagnosis not present

## 2023-06-08 DIAGNOSIS — E875 Hyperkalemia: Secondary | ICD-10-CM | POA: Diagnosis not present

## 2023-06-08 DIAGNOSIS — R110 Nausea: Secondary | ICD-10-CM | POA: Diagnosis not present

## 2023-06-24 DIAGNOSIS — N184 Chronic kidney disease, stage 4 (severe): Secondary | ICD-10-CM | POA: Diagnosis not present

## 2023-06-24 DIAGNOSIS — I350 Nonrheumatic aortic (valve) stenosis: Secondary | ICD-10-CM | POA: Diagnosis not present

## 2023-06-24 DIAGNOSIS — I10 Essential (primary) hypertension: Secondary | ICD-10-CM | POA: Diagnosis not present

## 2023-06-24 DIAGNOSIS — J479 Bronchiectasis, uncomplicated: Secondary | ICD-10-CM | POA: Diagnosis not present

## 2023-07-05 DIAGNOSIS — L570 Actinic keratosis: Secondary | ICD-10-CM | POA: Diagnosis not present

## 2023-07-05 DIAGNOSIS — D485 Neoplasm of uncertain behavior of skin: Secondary | ICD-10-CM | POA: Diagnosis not present

## 2023-07-05 DIAGNOSIS — C44529 Squamous cell carcinoma of skin of other part of trunk: Secondary | ICD-10-CM | POA: Diagnosis not present

## 2023-07-05 DIAGNOSIS — C44519 Basal cell carcinoma of skin of other part of trunk: Secondary | ICD-10-CM | POA: Diagnosis not present

## 2023-07-05 DIAGNOSIS — C44622 Squamous cell carcinoma of skin of right upper limb, including shoulder: Secondary | ICD-10-CM | POA: Diagnosis not present

## 2023-07-24 DIAGNOSIS — J479 Bronchiectasis, uncomplicated: Secondary | ICD-10-CM | POA: Diagnosis not present

## 2023-07-24 DIAGNOSIS — I10 Essential (primary) hypertension: Secondary | ICD-10-CM | POA: Diagnosis not present

## 2023-07-24 DIAGNOSIS — I350 Nonrheumatic aortic (valve) stenosis: Secondary | ICD-10-CM | POA: Diagnosis not present

## 2023-07-24 DIAGNOSIS — N184 Chronic kidney disease, stage 4 (severe): Secondary | ICD-10-CM | POA: Diagnosis not present

## 2023-08-22 DIAGNOSIS — R413 Other amnesia: Secondary | ICD-10-CM | POA: Diagnosis not present

## 2023-08-22 DIAGNOSIS — I10 Essential (primary) hypertension: Secondary | ICD-10-CM | POA: Diagnosis not present

## 2023-08-22 DIAGNOSIS — E441 Mild protein-calorie malnutrition: Secondary | ICD-10-CM | POA: Diagnosis not present

## 2023-08-22 DIAGNOSIS — E538 Deficiency of other specified B group vitamins: Secondary | ICD-10-CM | POA: Diagnosis not present

## 2023-08-22 DIAGNOSIS — I700 Atherosclerosis of aorta: Secondary | ICD-10-CM | POA: Diagnosis not present

## 2023-08-22 DIAGNOSIS — I5189 Other ill-defined heart diseases: Secondary | ICD-10-CM | POA: Diagnosis not present

## 2023-08-22 DIAGNOSIS — I7121 Aneurysm of the ascending aorta, without rupture: Secondary | ICD-10-CM | POA: Diagnosis not present

## 2023-08-22 DIAGNOSIS — R5383 Other fatigue: Secondary | ICD-10-CM | POA: Diagnosis not present

## 2023-08-22 DIAGNOSIS — J479 Bronchiectasis, uncomplicated: Secondary | ICD-10-CM | POA: Diagnosis not present

## 2023-08-22 DIAGNOSIS — E079 Disorder of thyroid, unspecified: Secondary | ICD-10-CM | POA: Diagnosis not present

## 2023-08-22 DIAGNOSIS — I350 Nonrheumatic aortic (valve) stenosis: Secondary | ICD-10-CM | POA: Diagnosis not present

## 2023-08-22 DIAGNOSIS — N184 Chronic kidney disease, stage 4 (severe): Secondary | ICD-10-CM | POA: Diagnosis not present

## 2023-08-22 DIAGNOSIS — Z0000 Encounter for general adult medical examination without abnormal findings: Secondary | ICD-10-CM | POA: Diagnosis not present

## 2023-08-24 DIAGNOSIS — N184 Chronic kidney disease, stage 4 (severe): Secondary | ICD-10-CM | POA: Diagnosis not present

## 2023-08-24 DIAGNOSIS — I350 Nonrheumatic aortic (valve) stenosis: Secondary | ICD-10-CM | POA: Diagnosis not present

## 2023-08-24 DIAGNOSIS — J479 Bronchiectasis, uncomplicated: Secondary | ICD-10-CM | POA: Diagnosis not present

## 2023-08-24 DIAGNOSIS — I10 Essential (primary) hypertension: Secondary | ICD-10-CM | POA: Diagnosis not present

## 2023-09-24 DIAGNOSIS — J479 Bronchiectasis, uncomplicated: Secondary | ICD-10-CM | POA: Diagnosis not present

## 2023-09-24 DIAGNOSIS — N184 Chronic kidney disease, stage 4 (severe): Secondary | ICD-10-CM | POA: Diagnosis not present

## 2023-09-24 DIAGNOSIS — I350 Nonrheumatic aortic (valve) stenosis: Secondary | ICD-10-CM | POA: Diagnosis not present

## 2023-09-24 DIAGNOSIS — I10 Essential (primary) hypertension: Secondary | ICD-10-CM | POA: Diagnosis not present

## 2023-09-27 DIAGNOSIS — C44529 Squamous cell carcinoma of skin of other part of trunk: Secondary | ICD-10-CM | POA: Diagnosis not present

## 2023-10-24 DIAGNOSIS — N184 Chronic kidney disease, stage 4 (severe): Secondary | ICD-10-CM | POA: Diagnosis not present

## 2023-10-24 DIAGNOSIS — I10 Essential (primary) hypertension: Secondary | ICD-10-CM | POA: Diagnosis not present

## 2023-10-24 DIAGNOSIS — I350 Nonrheumatic aortic (valve) stenosis: Secondary | ICD-10-CM | POA: Diagnosis not present

## 2023-10-24 DIAGNOSIS — J479 Bronchiectasis, uncomplicated: Secondary | ICD-10-CM | POA: Diagnosis not present
